# Patient Record
Sex: Female | Born: 1970 | Race: White | Hispanic: No | Marital: Married | Smoking: Never smoker
Health system: Southern US, Community
[De-identification: ages and names within clinical notes are randomized; demographics above are authoritative.]

## PROBLEM LIST (undated history)

## (undated) DIAGNOSIS — E559 Vitamin D deficiency, unspecified: Secondary | ICD-10-CM

## (undated) DIAGNOSIS — T7840XA Allergy, unspecified, initial encounter: Secondary | ICD-10-CM

## (undated) DIAGNOSIS — R768 Other specified abnormal immunological findings in serum: Secondary | ICD-10-CM

## (undated) DIAGNOSIS — E785 Hyperlipidemia, unspecified: Secondary | ICD-10-CM

## (undated) DIAGNOSIS — K209 Esophagitis, unspecified without bleeding: Secondary | ICD-10-CM

## (undated) DIAGNOSIS — G43909 Migraine, unspecified, not intractable, without status migrainosus: Secondary | ICD-10-CM

## (undated) DIAGNOSIS — K219 Gastro-esophageal reflux disease without esophagitis: Secondary | ICD-10-CM

## (undated) DIAGNOSIS — F329 Major depressive disorder, single episode, unspecified: Secondary | ICD-10-CM

## (undated) DIAGNOSIS — F32A Depression, unspecified: Secondary | ICD-10-CM

## (undated) DIAGNOSIS — G4700 Insomnia, unspecified: Secondary | ICD-10-CM

## (undated) HISTORY — DX: Major depressive disorder, single episode, unspecified: F32.9

## (undated) HISTORY — DX: Gastro-esophageal reflux disease without esophagitis: K21.9

## (undated) HISTORY — DX: Esophagitis, unspecified without bleeding: K20.90

## (undated) HISTORY — DX: Allergy, unspecified, initial encounter: T78.40XA

## (undated) HISTORY — DX: Vitamin D deficiency, unspecified: E55.9

## (undated) HISTORY — DX: Depression, unspecified: F32.A

## (undated) HISTORY — PX: MOLE REMOVAL: SHX2046

## (undated) HISTORY — DX: Hyperlipidemia, unspecified: E78.5

## (undated) HISTORY — DX: Migraine, unspecified, not intractable, without status migrainosus: G43.909

## (undated) HISTORY — DX: Other specified abnormal immunological findings in serum: R76.8

## (undated) HISTORY — DX: Insomnia, unspecified: G47.00

## (undated) HISTORY — PX: GYNECOLOGIC CRYOSURGERY: SHX857

## (undated) HISTORY — DX: Esophagitis, unspecified: K20.9

---
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
## 1998-11-15 ENCOUNTER — Inpatient Hospital Stay (HOSPITAL_COMMUNITY): Admission: AD | Admit: 1998-11-15 | Discharge: 1998-11-18 | Payer: Self-pay | Admitting: Obstetrics and Gynecology

## 1998-11-19 ENCOUNTER — Encounter: Admission: RE | Admit: 1998-11-19 | Discharge: 1999-02-17 | Payer: Self-pay | Admitting: Obstetrics and Gynecology

## 1998-12-29 ENCOUNTER — Other Ambulatory Visit: Admission: RE | Admit: 1998-12-29 | Discharge: 1998-12-29 | Payer: Self-pay | Admitting: Obstetrics and Gynecology

## 1999-12-26 ENCOUNTER — Other Ambulatory Visit: Admission: RE | Admit: 1999-12-26 | Discharge: 1999-12-26 | Payer: Self-pay | Admitting: Obstetrics and Gynecology

## 2000-02-06 ENCOUNTER — Inpatient Hospital Stay (HOSPITAL_COMMUNITY): Admission: AD | Admit: 2000-02-06 | Discharge: 2000-02-06 | Payer: Self-pay | Admitting: Obstetrics and Gynecology

## 2000-02-07 ENCOUNTER — Observation Stay (HOSPITAL_COMMUNITY): Admission: AD | Admit: 2000-02-07 | Discharge: 2000-02-07 | Payer: Self-pay | Admitting: Obstetrics and Gynecology

## 2000-05-08 ENCOUNTER — Inpatient Hospital Stay (HOSPITAL_COMMUNITY): Admission: AD | Admit: 2000-05-08 | Discharge: 2000-05-08 | Payer: Self-pay | Admitting: Obstetrics and Gynecology

## 2000-08-16 ENCOUNTER — Inpatient Hospital Stay (HOSPITAL_COMMUNITY): Admission: AD | Admit: 2000-08-16 | Discharge: 2000-08-18 | Payer: Self-pay | Admitting: Obstetrics and Gynecology

## 2000-08-19 ENCOUNTER — Encounter: Admission: RE | Admit: 2000-08-19 | Discharge: 2000-09-18 | Payer: Self-pay | Admitting: Obstetrics and Gynecology

## 2000-09-19 ENCOUNTER — Encounter: Admission: RE | Admit: 2000-09-19 | Discharge: 2000-10-19 | Payer: Self-pay | Admitting: Obstetrics and Gynecology

## 2000-10-08 ENCOUNTER — Other Ambulatory Visit: Admission: RE | Admit: 2000-10-08 | Discharge: 2000-10-08 | Payer: Self-pay | Admitting: Obstetrics and Gynecology

## 2000-11-19 ENCOUNTER — Encounter: Admission: RE | Admit: 2000-11-19 | Discharge: 2000-12-19 | Payer: Self-pay | Admitting: Obstetrics and Gynecology

## 2001-01-19 ENCOUNTER — Encounter: Admission: RE | Admit: 2001-01-19 | Discharge: 2001-02-18 | Payer: Self-pay | Admitting: Obstetrics and Gynecology

## 2001-02-19 ENCOUNTER — Encounter: Admission: RE | Admit: 2001-02-19 | Discharge: 2001-03-21 | Payer: Self-pay | Admitting: Obstetrics and Gynecology

## 2001-04-19 ENCOUNTER — Encounter: Admission: RE | Admit: 2001-04-19 | Discharge: 2001-05-19 | Payer: Self-pay | Admitting: Obstetrics and Gynecology

## 2001-11-12 ENCOUNTER — Other Ambulatory Visit: Admission: RE | Admit: 2001-11-12 | Discharge: 2001-11-12 | Payer: Self-pay | Admitting: Obstetrics and Gynecology

## 2002-06-18 ENCOUNTER — Ambulatory Visit (HOSPITAL_COMMUNITY): Admission: RE | Admit: 2002-06-18 | Discharge: 2002-06-18 | Payer: Self-pay | Admitting: Internal Medicine

## 2002-12-02 ENCOUNTER — Other Ambulatory Visit: Admission: RE | Admit: 2002-12-02 | Discharge: 2002-12-02 | Payer: Self-pay | Admitting: Obstetrics and Gynecology

## 2003-12-22 ENCOUNTER — Other Ambulatory Visit: Admission: RE | Admit: 2003-12-22 | Discharge: 2003-12-22 | Payer: Self-pay | Admitting: Obstetrics and Gynecology

## 2005-02-01 ENCOUNTER — Other Ambulatory Visit: Admission: RE | Admit: 2005-02-01 | Discharge: 2005-02-01 | Payer: Self-pay | Admitting: Obstetrics and Gynecology

## 2010-06-17 LAB — HM PAP SMEAR

## 2011-06-17 LAB — HM MAMMOGRAPHY

## 2011-07-12 LAB — FECAL OCCULT BLOOD, GUAIAC: Fecal Occult Blood: NEGATIVE

## 2012-07-16 ENCOUNTER — Other Ambulatory Visit (HOSPITAL_COMMUNITY): Payer: Self-pay | Admitting: Internal Medicine

## 2012-07-16 DIAGNOSIS — R1310 Dysphagia, unspecified: Secondary | ICD-10-CM

## 2012-07-22 ENCOUNTER — Ambulatory Visit (HOSPITAL_COMMUNITY)
Admission: RE | Admit: 2012-07-22 | Discharge: 2012-07-22 | Disposition: A | Discharge status: Home / Self Care | Payer: BC Managed Care – PPO | Source: Ambulatory Visit | Attending: Internal Medicine | Admitting: Internal Medicine

## 2012-07-22 DIAGNOSIS — R1310 Dysphagia, unspecified: Secondary | ICD-10-CM | POA: Insufficient documentation

## 2012-07-22 DIAGNOSIS — E041 Nontoxic single thyroid nodule: Secondary | ICD-10-CM | POA: Insufficient documentation

## 2012-07-25 ENCOUNTER — Other Ambulatory Visit: Payer: Self-pay | Admitting: Otolaryngology

## 2012-07-25 DIAGNOSIS — E041 Nontoxic single thyroid nodule: Secondary | ICD-10-CM

## 2012-08-06 ENCOUNTER — Ambulatory Visit
Admission: RE | Admit: 2012-08-06 | Discharge: 2012-08-06 | Disposition: A | Discharge status: Home / Self Care | Payer: BC Managed Care – PPO | Source: Ambulatory Visit | Attending: Otolaryngology | Admitting: Otolaryngology

## 2012-08-06 ENCOUNTER — Other Ambulatory Visit (HOSPITAL_COMMUNITY)
Admission: RE | Admit: 2012-08-06 | Discharge: 2012-08-06 | Disposition: A | Discharge status: Home / Self Care | Payer: BC Managed Care – PPO | Source: Ambulatory Visit | Attending: Interventional Radiology | Admitting: Interventional Radiology

## 2012-08-06 DIAGNOSIS — E049 Nontoxic goiter, unspecified: Secondary | ICD-10-CM | POA: Insufficient documentation

## 2012-08-06 DIAGNOSIS — E041 Nontoxic single thyroid nodule: Secondary | ICD-10-CM

## 2012-11-29 ENCOUNTER — Ambulatory Visit: Payer: BC Managed Care – PPO | Admitting: Emergency Medicine

## 2012-11-29 ENCOUNTER — Encounter: Payer: Self-pay | Admitting: Emergency Medicine

## 2012-11-29 VITALS — BP 110/62 | HR 66 | Temp 98.0°F | Resp 18 | Ht 65.0 in | Wt 148.0 lb

## 2012-11-29 DIAGNOSIS — E559 Vitamin D deficiency, unspecified: Secondary | ICD-10-CM

## 2012-11-29 DIAGNOSIS — E782 Mixed hyperlipidemia: Secondary | ICD-10-CM | POA: Insufficient documentation

## 2012-11-29 DIAGNOSIS — R5383 Other fatigue: Secondary | ICD-10-CM

## 2012-11-29 DIAGNOSIS — R300 Dysuria: Secondary | ICD-10-CM

## 2012-11-29 DIAGNOSIS — R5381 Other malaise: Secondary | ICD-10-CM

## 2012-11-29 LAB — CBC WITH DIFFERENTIAL/PLATELET
Basophils Absolute: 0 10*3/uL (ref 0.0–0.1)
Basophils Relative: 0 % (ref 0–1)
Eosinophils Absolute: 0.1 10*3/uL (ref 0.0–0.7)
Eosinophils Relative: 1 % (ref 0–5)
HCT: 41.6 % (ref 36.0–46.0)
Hemoglobin: 14 g/dL (ref 12.0–15.0)
Lymphocytes Relative: 18 % (ref 12–46)
Lymphs Abs: 1.7 10*3/uL (ref 0.7–4.0)
MCH: 30.4 pg (ref 26.0–34.0)
MCHC: 33.7 g/dL (ref 30.0–36.0)
MCV: 90.4 fL (ref 78.0–100.0)
Monocytes Absolute: 0.8 10*3/uL (ref 0.1–1.0)
Monocytes Relative: 8 % (ref 3–12)
Neutro Abs: 7.3 10*3/uL (ref 1.7–7.7)
Neutrophils Relative %: 73 % (ref 43–77)
Platelets: 287 10*3/uL (ref 150–400)
RBC: 4.6 MIL/uL (ref 3.87–5.11)
RDW: 13.1 % (ref 11.5–15.5)
WBC: 10 10*3/uL (ref 4.0–10.5)

## 2012-11-29 LAB — HEPATIC FUNCTION PANEL
ALT: 14 U/L (ref 0–35)
AST: 18 U/L (ref 0–37)
Albumin: 4.6 g/dL (ref 3.5–5.2)
Alkaline Phosphatase: 39 U/L (ref 39–117)
Bilirubin, Direct: 0.1 mg/dL (ref 0.0–0.3)
Indirect Bilirubin: 0.6 mg/dL (ref 0.0–0.9)
Total Bilirubin: 0.7 mg/dL (ref 0.3–1.2)
Total Protein: 7.7 g/dL (ref 6.0–8.3)

## 2012-11-29 LAB — LIPID PANEL
Cholesterol: 188 mg/dL (ref 0–200)
HDL: 77 mg/dL (ref >39)
LDL Cholesterol: 95 mg/dL (ref 0–99)
Total CHOL/HDL Ratio: 2.4 Ratio
Triglycerides: 78 mg/dL (ref <150)
VLDL: 16 mg/dL (ref 0–40)

## 2012-11-29 LAB — BASIC METABOLIC PANEL WITH GFR
BUN: 14 mg/dL (ref 6–23)
CO2: 30 mEq/L (ref 19–32)
Calcium: 10 mg/dL (ref 8.4–10.5)
Chloride: 101 mEq/L (ref 96–112)
Creat: 0.81 mg/dL (ref 0.50–1.10)
GFR, Est African American: >89 mL/min
GFR, Est Non African American: >89 mL/min
Glucose, Bld: 84 mg/dL (ref 70–99)
Potassium: 4.3 mEq/L (ref 3.5–5.3)
Sodium: 139 mEq/L (ref 135–145)

## 2012-11-29 LAB — MAGNESIUM: Magnesium: 2.2 mg/dL (ref 1.5–2.5)

## 2012-11-29 MED ORDER — CIPROFLOXACIN HCL 500 MG PO TABS: 500.0000 mg | ORAL_TABLET | Freq: Two times a day (BID) | ORAL | Status: AC

## 2012-11-29 NOTE — Progress Notes (Signed)
  Subjective:    Patient ID: [name], female    DOB: Sep 20, 1970, 43 y.o.   MRN: [id number]  HPI Comments: 42 YO PRESENTS FOR RECHECK OF CHOLESTEROL, Vit D, B12. She is eating healthy and exercises regularly. She increased RYRE to 2 pills at last ov. Decreased vit D to 5000 IU qd and stopped B12 because levels were elevated. She also needs evaluation for dysuria and frequency since this a.m. She notes she has not been doing well with H2O consumption and has been holding urine for long periods.    No current outpatient prescriptions on file prior to visit.   No current facility-administered medications on file prior to visit.   Past Medical History  Diagnosis Date  . Hyperlipidemia   . Allergy   . Migraine   . Unspecified vitamin D deficiency      Review of Systems  Constitutional: Positive for fever.  Gastrointestinal: Positive for abdominal pain.  Genitourinary: Positive for dysuria and frequency.  All other systems reviewed and are negative.       Objective:   Physical Exam  Nursing note and vitals reviewed. Constitutional: She is oriented to person, place, and time. She appears well-developed and well-nourished.  HENT:  Head: Normocephalic and atraumatic.  Eyes: Pupils are equal, round, and reactive to light.  Neck: Normal range of motion. Neck supple.  Cardiovascular: Normal rate, regular rhythm, normal heart sounds and intact distal pulses.   Pulmonary/Chest: Effort normal and breath sounds normal.  Abdominal: Soft. Bowel sounds are normal. She exhibits no mass. There is tenderness. There is no guarding.  suprapubic  Musculoskeletal: Normal range of motion.  Neurological: She is alert and oriented to person, place, and time.  Skin: Skin is warm and dry.  Psychiatric: She has a normal mood and affect. Judgment normal.          Assessment & Plan:  1. Recheck chol, b12, Vit D with dose changes.  2. Check labs for UTI, Cipro 500 AD if symptoms increase.  Hygiene explained. Increase H2O

## 2012-11-29 NOTE — Patient Instructions (Signed)
Fat and Cholesterol Control Diet Fat and cholesterol levels in your blood and organs are influenced by your diet. High levels of fat and cholesterol may lead to diseases of the heart, small and large blood vessels, gallbladder, liver, and pancreas. CONTROLLING FAT AND CHOLESTEROL WITH DIET Although exercise and lifestyle factors are important, your diet is key. That is because certain foods are known to raise cholesterol and others to lower it. The goal is to balance foods for their effect on cholesterol and more importantly, to replace saturated and trans fat with other types of fat, such as monounsaturated fat, polyunsaturated fat, and omega-3 fatty acids. On average, a person should consume no more than 15 to 17 g of saturated fat daily. Saturated and trans fats are considered "bad" fats, and they will raise LDL cholesterol. Saturated fats are primarily found in animal products such as meats, butter, and cream. However, that does not mean you need to give up all your favorite foods. Today, there are good tasting, low-fat, low-cholesterol substitutes for most of the things you like to eat. Choose low-fat or nonfat alternatives. Choose round or loin cuts of red meat. These types of cuts are lowest in fat and cholesterol. Chicken (without the skin), fish, veal, and ground turkey breast are great choices. Eliminate fatty meats, such as hot dogs and salami. Even shellfish have little or no saturated fat. Have a 3 oz (85 g) portion when you eat lean meat, poultry, or fish. Trans fats are also called "partially hydrogenated oils." They are oils that have been scientifically manipulated so that they are solid at room temperature resulting in a longer shelf life and improved taste and texture of foods in which they are added. Trans fats are found in stick margarine, some tub margarines, cookies, crackers, and baked goods.  When baking and cooking, oils are a great substitute for butter. The monounsaturated oils are  especially beneficial since it is believed they lower LDL and raise HDL. The oils you should avoid entirely are saturated tropical oils, such as coconut and palm.  Remember to eat a lot from food groups that are naturally free of saturated and trans fat, including fish, fruit, vegetables, beans, grains (barley, rice, couscous, bulgur wheat), and pasta (without cream sauces).  IDENTIFYING FOODS THAT LOWER FAT AND CHOLESTEROL  Soluble fiber may lower your cholesterol. This type of fiber is found in fruits such as apples, vegetables such as broccoli, potatoes, and carrots, legumes such as beans, peas, and lentils, and grains such as barley. Foods fortified with plant sterols (phytosterol) may also lower cholesterol. You should eat at least 2 g per day of these foods for a cholesterol lowering effect.  Read package labels to identify low-saturated fats, trans fat free, and low-fat foods at the supermarket. Select cheeses that have only 2 to 3 g saturated fat per ounce. Use a heart-healthy tub margarine that is free of trans fats or partially hydrogenated oil. When buying baked goods (cookies, crackers), avoid partially hydrogenated oils. Breads and muffins should be made from whole grains (whole-wheat or whole oat flour, instead of "flour" or "enriched flour"). Buy non-creamy canned soups with reduced salt and no added fats.  FOOD PREPARATION TECHNIQUES  Never deep-fry. If you must fry, either stir-fry, which uses very little fat, or use non-stick cooking sprays. When possible, broil, bake, or roast meats, and steam vegetables. Instead of putting butter or margarine on vegetables, use lemon and herbs, applesauce, and cinnamon (for squash and sweet potatoes). Use nonfat   yogurt, salsa, and low-fat dressings for salads.  LOW-SATURATED FAT / LOW-FAT FOOD SUBSTITUTES Meats / Saturated Fat (g)  Avoid: Steak, marbled (3 oz/85 g) / 11 g  Choose: Steak, lean (3 oz/85 g) / 4 g  Avoid: Hamburger (3 oz/85 g) / 7  g  Choose: Hamburger, lean (3 oz/85 g) / 5 g  Avoid: Ham (3 oz/85 g) / 6 g  Choose: Ham, lean cut (3 oz/85 g) / 2.4 g  Avoid: Chicken, with skin, dark meat (3 oz/85 g) / 4 g  Choose: Chicken, skin removed, dark meat (3 oz/85 g) / 2 g  Avoid: Chicken, with skin, light meat (3 oz/85 g) / 2.5 g  Choose: Chicken, skin removed, light meat (3 oz/85 g) / 1 g Dairy / Saturated Fat (g)  Avoid: Whole milk (1 cup) / 5 g  Choose: Low-fat milk, 2% (1 cup) / 3 g  Choose: Low-fat milk, 1% (1 cup) / 1.5 g  Choose: Skim milk (1 cup) / 0.3 g  Avoid: Hard cheese (1 oz/28 g) / 6 g  Choose: Skim milk cheese (1 oz/28 g) / 2 to 3 g  Avoid: Cottage cheese, 4% fat (1 cup) / 6.5 g  Choose: Low-fat cottage cheese, 1% fat (1 cup) / 1.5 g  Avoid: Ice cream (1 cup) / 9 g  Choose: Sherbet (1 cup) / 2.5 g  Choose: Nonfat frozen yogurt (1 cup) / 0.3 g  Choose: Frozen fruit bar / trace  Avoid: Whipped cream (1 tbs) / 3.5 g  Choose: Nondairy whipped topping (1 tbs) / 1 g Condiments / Saturated Fat (g)  Avoid: Mayonnaise (1 tbs) / 2 g  Choose: Low-fat mayonnaise (1 tbs) / 1 g  Avoid: Butter (1 tbs) / 7 g  Choose: Extra light margarine (1 tbs) / 1 g  Avoid: Coconut oil (1 tbs) / 11.8 g  Choose: Olive oil (1 tbs) / 1.8 g  Choose: Corn oil (1 tbs) / 1.7 g  Choose: Safflower oil (1 tbs) / 1.2 g  Choose: Sunflower oil (1 tbs) / 1.4 g  Choose: Soybean oil (1 tbs) / 2.4 g  Choose: Canola oil (1 tbs) / 1 g Document Released: 01/02/2005 Document Revised: 04/29/2012 Document Reviewed: 06/23/2010 [employer] Patient Information 2014 [place], [place]. Urinary Tract Infection Urinary tract infections (UTIs) can develop anywhere along your urinary tract. Your urinary tract is your body's drainage system for removing wastes and extra water. Your urinary tract includes two kidneys, two ureters, a bladder, and a urethra. Your kidneys are a pair of bean-shaped organs. Each kidney is about the size of  your fist. They are located below your ribs, one on each side of your spine. CAUSES Infections are caused by microbes, which are microscopic organisms, including fungi, viruses, and bacteria. These organisms are so small that they can only be seen through a microscope. Bacteria are the microbes that most commonly cause UTIs. SYMPTOMS  Symptoms of UTIs may vary by age and gender of the patient and by the location of the infection. Symptoms in young women typically include a frequent and intense urge to urinate and a painful, burning feeling in the bladder or urethra during urination. Older women and men are more likely to be tired, shaky, and weak and have muscle aches and abdominal pain. A fever may mean the infection is in your kidneys. Other symptoms of a kidney infection include pain in your back or sides below the ribs, nausea, and vomiting. DIAGNOSIS To diagnose a  UTI, your caregiver will ask you about your symptoms. Your caregiver also will ask to provide a urine sample. The urine sample will be tested for bacteria and white blood cells. White blood cells are made by your body to help fight infection. TREATMENT  Typically, UTIs can be treated with medication. Because most UTIs are caused by a bacterial infection, they usually can be treated with the use of antibiotics. The choice of antibiotic and length of treatment depend on your symptoms and the type of bacteria causing your infection. HOME CARE INSTRUCTIONS  If you were prescribed antibiotics, take them exactly as your caregiver instructs you. Finish the medication even if you feel better after you have only taken some of the medication.  Drink enough water and fluids to keep your urine clear or pale yellow.  Avoid caffeine, tea, and carbonated beverages. They tend to irritate your bladder.  Empty your bladder often. Avoid holding urine for long periods of time.  Empty your bladder before and after sexual intercourse.  After a bowel  movement, women should cleanse from front to back. Use each tissue only once. SEEK MEDICAL CARE IF:   You have back pain.  You develop a fever.  Your symptoms do not begin to resolve within 3 days. SEEK IMMEDIATE MEDICAL CARE IF:   You have severe back pain or lower abdominal pain.  You develop chills.  You have nausea or vomiting.  You have continued burning or discomfort with urination. MAKE SURE YOU:   Understand these instructions.  Will watch your condition.  Will get help right away if you are not doing well or get worse. Document Released: 10/12/2004 Document Revised: 07/04/2011 Document Reviewed: 02/10/2011 [facility] Patient Information 2014 [place], [place].

## 2012-11-30 LAB — TSH: TSH: 1.145 u[IU]/mL (ref 0.350–4.500)

## 2012-11-30 LAB — URINALYSIS, ROUTINE W REFLEX MICROSCOPIC
Bilirubin Urine: NEGATIVE
Glucose, UA: NEGATIVE mg/dL
Ketones, ur: NEGATIVE mg/dL
Nitrite: NEGATIVE
Protein, ur: NEGATIVE mg/dL
Specific Gravity, Urine: 1.013 (ref 1.005–1.030)
Urobilinogen, UA: 0.2 mg/dL (ref 0.0–1.0)
pH: 8 (ref 5.0–8.0)

## 2012-11-30 LAB — URINALYSIS, MICROSCOPIC ONLY
Casts: NONE SEEN
Crystals: NONE SEEN
Squamous Epithelial / LPF: NONE SEEN

## 2012-11-30 LAB — VITAMIN B12: Vitamin B-12: 602 pg/mL (ref 211–911)

## 2012-11-30 LAB — VITAMIN D 25 HYDROXY (VIT D DEFICIENCY, FRACTURES): Vit D, 25-Hydroxy: 100 ng/mL — ABNORMAL HIGH (ref 30–89)

## 2012-12-01 LAB — URINE CULTURE: Culture: >=100000

## 2012-12-03 ENCOUNTER — Other Ambulatory Visit: Payer: Self-pay | Admitting: Internal Medicine

## 2012-12-03 MED ORDER — NITROFURANTOIN MONOHYD MACRO 100 MG PO CAPS: 100.0000 mg | ORAL_CAPSULE | Freq: Two times a day (BID) | ORAL | Status: AC

## 2012-12-27 ENCOUNTER — Other Ambulatory Visit: Payer: Self-pay | Admitting: Emergency Medicine

## 2012-12-30 ENCOUNTER — Other Ambulatory Visit: Payer: Self-pay | Admitting: Emergency Medicine

## 2012-12-30 ENCOUNTER — Other Ambulatory Visit: Payer: Self-pay

## 2012-12-30 DIAGNOSIS — E559 Vitamin D deficiency, unspecified: Secondary | ICD-10-CM

## 2012-12-30 DIAGNOSIS — N390 Urinary tract infection, site not specified: Secondary | ICD-10-CM

## 2012-12-30 DIAGNOSIS — E538 Deficiency of other specified B group vitamins: Secondary | ICD-10-CM

## 2012-12-30 DIAGNOSIS — A048 Other specified bacterial intestinal infections: Secondary | ICD-10-CM

## 2012-12-31 ENCOUNTER — Other Ambulatory Visit: Payer: Self-pay

## 2013-01-01 ENCOUNTER — Other Ambulatory Visit: Payer: BC Managed Care – PPO

## 2013-01-01 DIAGNOSIS — E559 Vitamin D deficiency, unspecified: Secondary | ICD-10-CM

## 2013-01-01 DIAGNOSIS — N390 Urinary tract infection, site not specified: Secondary | ICD-10-CM

## 2013-01-01 DIAGNOSIS — N3000 Acute cystitis without hematuria: Secondary | ICD-10-CM

## 2013-01-01 DIAGNOSIS — I10 Essential (primary) hypertension: Secondary | ICD-10-CM

## 2013-01-01 DIAGNOSIS — R5383 Other fatigue: Secondary | ICD-10-CM

## 2013-01-01 DIAGNOSIS — E538 Deficiency of other specified B group vitamins: Secondary | ICD-10-CM

## 2013-01-01 DIAGNOSIS — R5381 Other malaise: Secondary | ICD-10-CM

## 2013-01-01 LAB — CBC WITH DIFFERENTIAL/PLATELET
Basophils Absolute: 0 10*3/uL (ref 0.0–0.1)
Basophils Relative: 1 % (ref 0–1)
Eosinophils Absolute: 0.1 10*3/uL (ref 0.0–0.7)
Eosinophils Relative: 2 % (ref 0–5)
HCT: 41.5 % (ref 36.0–46.0)
Hemoglobin: 13.8 g/dL (ref 12.0–15.0)
Lymphocytes Relative: 33 % (ref 12–46)
Lymphs Abs: 2.1 10*3/uL (ref 0.7–4.0)
MCH: 29.9 pg (ref 26.0–34.0)
MCHC: 33.3 g/dL (ref 30.0–36.0)
MCV: 90 fL (ref 78.0–100.0)
Monocytes Absolute: 0.6 10*3/uL (ref 0.1–1.0)
Monocytes Relative: 9 % (ref 3–12)
Neutro Abs: 3.5 10*3/uL (ref 1.7–7.7)
Neutrophils Relative %: 55 % (ref 43–77)
Platelets: 269 10*3/uL (ref 150–400)
RBC: 4.61 MIL/uL (ref 3.87–5.11)
RDW: 12.7 % (ref 11.5–15.5)
WBC: 6.3 10*3/uL (ref 4.0–10.5)

## 2013-01-01 LAB — VITAMIN B12: Vitamin B-12: 605 pg/mL (ref 211–911)

## 2013-01-02 LAB — URINALYSIS, ROUTINE W REFLEX MICROSCOPIC
Bilirubin Urine: NEGATIVE
Glucose, UA: NEGATIVE mg/dL
Hgb urine dipstick: NEGATIVE
Ketones, ur: NEGATIVE mg/dL
Leukocytes, UA: NEGATIVE
Nitrite: NEGATIVE
Protein, ur: NEGATIVE mg/dL
Specific Gravity, Urine: 1.016 (ref 1.005–1.030)
Urobilinogen, UA: 0.2 mg/dL (ref 0.0–1.0)
pH: 7 (ref 5.0–8.0)

## 2013-01-02 LAB — URINE CULTURE
Colony Count: NO GROWTH
Organism ID, Bacteria: NO GROWTH

## 2013-01-02 LAB — VITAMIN D 25 HYDROXY (VIT D DEFICIENCY, FRACTURES): Vit D, 25-Hydroxy: 86 ng/mL (ref 30–89)

## 2013-02-17 ENCOUNTER — Ambulatory Visit (INDEPENDENT_AMBULATORY_CARE_PROVIDER_SITE_OTHER): Payer: BC Managed Care – PPO | Admitting: Emergency Medicine

## 2013-02-17 ENCOUNTER — Encounter: Payer: Self-pay | Admitting: Emergency Medicine

## 2013-02-17 VITALS — BP 112/78 | HR 78 | Temp 98.0°F | Resp 18 | Ht 65.25 in | Wt 153.0 lb

## 2013-02-17 DIAGNOSIS — N63 Unspecified lump in unspecified breast: Secondary | ICD-10-CM

## 2013-02-17 DIAGNOSIS — N644 Mastodynia: Secondary | ICD-10-CM

## 2013-02-17 DIAGNOSIS — N6311 Unspecified lump in the right breast, upper outer quadrant: Secondary | ICD-10-CM

## 2013-02-17 NOTE — Patient Instructions (Signed)
Breast Tenderness Breast tenderness is a common problem for women of all ages. Breast tenderness may cause mild discomfort to severe pain. It has a variety of causes. Your health care provider will find out the likely cause of your breast tenderness by examining your breasts, asking you about symptoms, and ordering some tests. Breast tenderness usually does not mean you have breast cancer. HOME CARE INSTRUCTIONS  Breast tenderness often can be handled at home. You can try:  Getting fitted for a new bra that provides more support, especially during exercise.  Wearing a more supportive bra or sports bra while sleeping when your breasts are very tender.  If you have a breast injury, apply ice to the area:  Put ice in a plastic bag.  Place a towel between your skin and the bag.  Leave the ice on for 20 minutes, 2 3 times a day.  If your breasts are too full of milk as a result of breastfeeding, try:  Expressing milk either by hand or with a breast pump.  Applying a warm compress to the breasts for relief.  Taking over-the-counter pain relievers, if approved by your health care provider.  Taking other medicines that your health care provider prescribes. These may include antibiotic medicines or birth control pills. Over the long term, your breast tenderness might be eased if you:  Cut down on caffeine.  Reduce the amount of fat in your diet. Keep a log of the days and times when your breasts are most tender. This will help you and your health care provider find the cause of the tenderness and how to relieve it. Also, learn how to do breast exams at home. This will help you notice if you have an unusual growth or lump that could cause tenderness. SEEK MEDICAL CARE IF:   Any part of your breast is hard, red, and hot to the touch. This could be a sign of infection.  Fluid is coming out of your nipples (and you are not breastfeeding). Especially watch for blood or pus.  You have a fever  as well as breast tenderness.  You have a new or painful lump in your breast that remains after your menstrual period ends.  You have tried to take care of the pain at home, but it has not gone away.  Your breast pain is getting worse, or the pain is making it hard to do the things you usually do during your day. Document Released: 12/16/2007 Document Revised: 09/04/2012 Document Reviewed: 08/01/2012 [employer] Patient Information 2014 [employer].  

## 2013-02-17 NOTE — Progress Notes (Signed)
   Subjective:    Patient ID: Shawna KnifeMonique W Justice, female    DOB: October 01, 1970, 43 y.o.   MRN: [id number]  HPI Comments: 43 yo female with breast pain on/off x several months. She notes pain has been more frequent since Dec. She notes self exam deep palpitation with mild edema. Yesterday symptoms increased with swelling and pain. 3D mammo in June WNL. No Hx of abnormal mammos. No breast cancer hx in family. She has had IUD for several years due for replacement in May- Merena. She has noted last 2 months with break through d/c. She has increased stress with work/ family. She denies trauma to area.  Current Outpatient Prescriptions on File Prior to Visit  Medication Sig Dispense Refill  . Ascorbic Acid (VITAMIN C) 1000 MG tablet Take 1,000 mg by mouth daily.      Marland Kitchen. aspirin 81 MG tablet Take 81 mg by mouth daily.      . calcium carbonate (OS-CAL) 600 MG TABS tablet Take 600 mg by mouth 2 (two) times daily with a meal.      . dexlansoprazole (DEXILANT) 60 MG capsule Take 60 mg by mouth daily.      . Magnesium 250 MG TABS Take by mouth.      . Multiple Vitamins-Minerals (MULTIVITAMIN WITH MINERALS) tablet Take 1 tablet by mouth. Three times per week      . Red Yeast Rice 600 MG CAPS Take 600 mg by mouth 2 (two) times daily.       No current facility-administered medications on file prior to visit.   ALLERGIES Tetracyclines & related  Past Medical History  Diagnosis Date  . Hyperlipidemia   . Allergy   . Migraine   . Unspecified vitamin D deficiency      Review of Systems  Constitutional:       Right Breast pain  HENT: Positive for postnasal drip.   Respiratory: Positive for cough.   Musculoskeletal: Positive for myalgias.  All other systems reviewed and are negative.   BP 112/78  Pulse 78  Temp(Src) 98 F (36.7 C) (Temporal)  Resp 18  Ht 5' 5.25" (1.657 m)  Wt 153 lb (69.4 kg)  BMI 25.28 kg/m2     Objective:   Physical Exam  Nursing note and vitals reviewed. Constitutional:  She is oriented to person, place, and time. She appears well-developed and well-nourished.  HENT:  Head: Normocephalic and atraumatic.  Right Ear: External ear normal.  Left Ear: External ear normal.  Nose: Nose normal.  Mouth/Throat: Oropharynx is clear and moist.  Cloudy TM's bilaterally   Eyes: Conjunctivae and EOM are normal.  Neck: Normal range of motion.  Cardiovascular: Normal rate, regular rhythm, normal heart sounds and intact distal pulses.   Pulmonary/Chest: Effort normal and breath sounds normal.  Genitourinary:  Right breast with fullness/ pain at 10 o'clock area axillary with approx nickle size fullness  Musculoskeletal: Normal range of motion.  Lymphadenopathy:    She has no cervical adenopathy.  Neurological: She is alert and oriented to person, place, and time.  Skin: Skin is warm and dry.  Psychiatric: She has a normal mood and affect. Judgment normal.          Assessment & Plan:  1.Right breast pain with ? Lump/ fullness at 10 o'clock axillary- Get DX mammo and MRI to evaluate 2. Cough/ Allergic rhinitis- Allegra OTC, increase H2o, allergy hygiene explained.

## 2013-02-18 ENCOUNTER — Other Ambulatory Visit: Payer: Self-pay | Admitting: Emergency Medicine

## 2013-02-18 ENCOUNTER — Other Ambulatory Visit: Payer: BC Managed Care – PPO

## 2013-02-18 DIAGNOSIS — A048 Other specified bacterial intestinal infections: Secondary | ICD-10-CM

## 2013-02-19 LAB — HELICOBACTER PYLORI  SPECIAL ANTIGEN: H. PYLORI Antigen: NEGATIVE

## 2013-02-20 ENCOUNTER — Other Ambulatory Visit: Payer: Self-pay

## 2013-02-20 DIAGNOSIS — N6311 Unspecified lump in the right breast, upper outer quadrant: Secondary | ICD-10-CM

## 2013-02-20 DIAGNOSIS — N644 Mastodynia: Secondary | ICD-10-CM

## 2013-03-25 ENCOUNTER — Other Ambulatory Visit: Payer: Self-pay | Admitting: Emergency Medicine

## 2013-06-10 ENCOUNTER — Other Ambulatory Visit: Payer: Self-pay | Admitting: Emergency Medicine

## 2013-06-26 ENCOUNTER — Other Ambulatory Visit: Payer: Self-pay | Admitting: Emergency Medicine

## 2013-06-26 DIAGNOSIS — R221 Localized swelling, mass and lump, neck: Secondary | ICD-10-CM

## 2013-07-09 ENCOUNTER — Ambulatory Visit (INDEPENDENT_AMBULATORY_CARE_PROVIDER_SITE_OTHER): Payer: BC Managed Care – PPO | Admitting: Internal Medicine

## 2013-07-09 ENCOUNTER — Encounter: Payer: Self-pay | Admitting: Internal Medicine

## 2013-07-09 VITALS — BP 104/64 | HR 80 | Temp 98.9°F | Resp 12 | Ht 65.25 in | Wt 151.0 lb

## 2013-07-09 DIAGNOSIS — R220 Localized swelling, mass and lump, head: Secondary | ICD-10-CM

## 2013-07-09 DIAGNOSIS — E041 Nontoxic single thyroid nodule: Secondary | ICD-10-CM

## 2013-07-09 DIAGNOSIS — R221 Localized swelling, mass and lump, neck: Secondary | ICD-10-CM

## 2013-07-09 HISTORY — DX: Nontoxic single thyroid nodule: E04.1

## 2013-07-09 LAB — TSH: TSH: 0.16 u[IU]/mL — ABNORMAL LOW (ref 0.35–4.50)

## 2013-07-09 LAB — T3, FREE: T3, Free: 2.8 pg/mL (ref 2.3–4.2)

## 2013-07-09 LAB — T4, FREE: Free T4: 0.67 ng/dL (ref 0.60–1.60)

## 2013-07-09 NOTE — Patient Instructions (Signed)
Please stop at the lab. Please return in 1 year for recheck.

## 2013-07-09 NOTE — Progress Notes (Signed)
Patient ID: Shawna Forbes, female   DOB: 1970-12-17, 43 y.o.   MRN: [id number]   HPI  Shawna Forbes is a 43 y.o.-year-old female, referred by her PCP, Loree FeeMelissa Smith PA, in consultation for neck pressure in the context of a R thyroid nodule.  Pt started to have neck pressure ~ 1.5 years ago. For this reason, she had a Thyroid U/S (07/22/2012): R thyroid nodule 2.1 cm in the R lobe. FNA of this nodule (08/06/2012) >> benign. She persists in having the same neck pressure sxs, which are not permanent, but waxing and waning.  I reviewed pt's thyroid tests: Lab Results  Component Value Date   TSH 1.145 11/29/2012    Pt denies feeling nodules in neck, + pressure occasionally, + hoarseness, no dysphagia/no odynophagia, SOB with lying down.  Pt also c/o: - + heat intolerance - only this year - wakes up many times at night - for 2 years (benadryl) - no tremors - no palpitations - no anxiety/depression - no hyperdefecation/constipation - + weight gain 7-10 lbs in last 1.5 years - no dry skin - no hair falling  Pt does have a FH of thyroid ds: mother, father and aunts. No FH of thyroid cancer. No h/o radiation tx to head or neck.  No seaweed or kelp, no recent contrast studies. No steroid use. No herbal supplements.   She has acid reflux (after she was tx for H pylori) and is on Dexilant.   ROS: Constitutional: see HPi Eyes: no blurry vision, no xerophthalmia ENT: see HPI Cardiovascular: no CP/SOB/palpitations/leg swelling Respiratory: no cough/SOB Gastrointestinal: no N/V/D/C Musculoskeletal: no muscle/joint aches Skin: no rashes Neurological: no tremors/numbness/tingling/dizziness Psychiatric: no depression/anxiety  Past Medical History  Diagnosis Date  . Hyperlipidemia   . Allergy   . Migraine   . Unspecified vitamin D deficiency    Past Surgical History  Procedure Laterality Date  . Gynecologic cryosurgery    . Mole removal      multiple benign- Dr. Emily FilbertGould    History   Social History  . Marital Status: Married    Spouse Name: N/A    Number of Children: 2: 7314 and 43 y/o   Occupational History  . Emergency planning/management officerroject manager   Social History Main Topics  . Smoking status: Never Smoker   . Smokeless tobacco: Not on file  . Alcohol Use: No  . Drug Use: No  . Sexual Activity: Yes   Current Outpatient Prescriptions on File Prior to Visit  Medication Sig Dispense Refill  . Ascorbic Acid (VITAMIN C) 1000 MG tablet Take 1,000 mg by mouth daily.      Marland Kitchen. aspirin 81 MG tablet Take 81 mg by mouth daily.      . calcium carbonate (OS-CAL) 600 MG TABS tablet Take 600 mg by mouth 2 (two) times daily with a meal.      . DEXILANT 60 MG capsule TAKE 1 CAPSULE EVERY DAY  30 capsule  3  . ketoprofen (ORUDIS) 75 MG capsule TAKE 1 CAPSULE 3 TIMES A DAY  90 capsule  0  . Magnesium 250 MG TABS Take by mouth.      . Multiple Vitamins-Minerals (MULTIVITAMIN WITH MINERALS) tablet Take 1 tablet by mouth. Three times per week      . Red Yeast Rice 600 MG CAPS Take 600 mg by mouth 2 (two) times daily.      . Vitamin D, Cholecalciferol, 1000 UNITS TABS Take 1,000 Units by mouth. Three times per week  No current facility-administered medications on file prior to visit.   Allergies  Allergen Reactions  . Tetracyclines & Related     Hives   Family History  Problem Relation Age of Onset  . Hypertension Mother   . Hyperlipidemia Mother   . Hypothyroidism Mother   . Hypothyroidism Father   . Cancer Maternal Grandfather     spine  . Diabetes Other     juvenille   PE: BP 104/64  Pulse 80  Temp(Src) 98.9 F (37.2 C) (Oral)  Resp 12  Ht 5' 5.25" (1.657 m)  Wt 151 lb (68.493 kg)  BMI 24.95 kg/m2  SpO2 98% Wt Readings from Last 3 Encounters:  07/09/13 151 lb (68.493 kg)  02/17/13 153 lb (69.4 kg)  11/29/12 148 lb (67.132 kg)   Constitutional: normal weight, in NAD Eyes: PERRLA, EOMI, no exophthalmos ENT: moist mucous membranes, no thyromegaly, no cervical  lymphadenopathy Cardiovascular: RRR, No MRG Respiratory: CTA B Gastrointestinal: abdomen soft, NT, ND, BS+ Musculoskeletal: no deformities, strength intact in all 4;  Skin: moist, warm, no rashes Neurological: no tremor with outstretched hands, DTR normal in all 4  ASSESSMENT: 1. R thyroid nodule - thyroid U/S (07/22/2012):  Right thyroid lobe: 5.2 x 2.0 x 1.3 cm. Inhomogeneous.   Left thyroid lobe: 5.4 x 1.6 x 1.2 cm. Homogeneous.   Isthmus: 0.3 cm   Focal nodules: Largest nodule is seen in the lower pole region on the right, 2.1 x 1.0 x 1.4 cm. Nodule is solid and vascular. No associated microcalcifications or cystic spaces. Smaller nodules measures 0.3 x 0.9 x 0.9 cm and 0.3 x 0.8 x 0.9 cm in the mid and upper pole regions of the right thyroid lobe. No discrete nodules are identified in the left thyroid lobe.   Lymphadenopathy: None visualized. - FNA R thyroid nodule (08/06/2013): benign  2. Neck swelling/fullness  PLAN: 1. R thyroid nodule and 2. R neck pressure sensation - I reviewed the images of her thyroid ultrasound along with the patient. I pointed out that the dominant nodule was large, but it is very reassuring that the Bx was negative. We may need to repeat the Bx in the future, but not needed for now. We discussed that there is nothing in the thyroid gland that appears to press on her trachea, and the thyroid gland is borderline, not increased in size to cause compression sxs - the only thing that can cause fluctuation in the thyroid gland size and occasional compression is an increased anti-thyroid Ab level >> Hashimoto's thyroiditis >> we will check for these - will also check the TFTs since the last check was last year. - I will see her back in a year >> we will get a new thyroid U/S then to follow the R thyroid nodule  Office Visit on 07/09/2013  Component Date Value Ref Range Status  . TSH 07/09/2013 0.16* 0.35 - 4.50 uIU/mL Final  . Free T4 07/09/2013 0.67  0.60  - 1.60 ng/dL Final  . T3, Free 21/30/865706/24/2015 2.8  2.3 - 4.2 pg/mL Final  . Thyroid Peroxidase Antibody 07/09/2013 36.6* <35.0 IU/mL Final   Comment:                            The thyroid microsomal antigen has been shown to be Thyroid  Peroxidase (TPO).  This assay detects anti-TPO antibodies.  . Thyroglobulin Ab 07/09/2013 <20.0  <40.0 IU/mL Final   TPO Abs a little high, while TSH low. It is unclear whether she has Hashimoto's thyroiditis or subacute thyroiditis. We will need to recheck the labs in 6 weeks to further clarify. At this point, it is possible that her neck pressure sxs are 2/2 thyroid inflammation.

## 2013-07-10 LAB — THYROGLOBULIN ANTIBODY: Thyroglobulin Ab: <20 IU/mL (ref <40.0)

## 2013-07-10 LAB — THYROID PEROXIDASE ANTIBODY: Thyroperoxidase Ab SerPl-aCnc: 36.6 IU/mL — ABNORMAL HIGH (ref <35.0)

## 2013-07-24 ENCOUNTER — Other Ambulatory Visit: Payer: Self-pay | Admitting: Emergency Medicine

## 2013-07-29 ENCOUNTER — Encounter: Payer: Self-pay | Admitting: Emergency Medicine

## 2013-08-06 ENCOUNTER — Other Ambulatory Visit (INDEPENDENT_AMBULATORY_CARE_PROVIDER_SITE_OTHER): Payer: BC Managed Care – PPO

## 2013-08-06 DIAGNOSIS — R221 Localized swelling, mass and lump, neck: Secondary | ICD-10-CM

## 2013-08-06 DIAGNOSIS — R220 Localized swelling, mass and lump, head: Secondary | ICD-10-CM

## 2013-08-06 LAB — T3, FREE: T3, Free: 2.9 pg/mL (ref 2.3–4.2)

## 2013-08-06 LAB — T4, FREE: Free T4: 0.73 ng/dL (ref 0.60–1.60)

## 2013-08-06 LAB — TSH: TSH: 1.01 u[IU]/mL (ref 0.35–4.50)

## 2013-08-07 LAB — THYROID PEROXIDASE ANTIBODY: Thyroperoxidase Ab SerPl-aCnc: 35.4 IU/mL — ABNORMAL HIGH (ref <35.0)

## 2013-09-02 ENCOUNTER — Encounter: Payer: Self-pay | Admitting: Physician Assistant

## 2013-10-20 ENCOUNTER — Other Ambulatory Visit: Payer: Self-pay | Admitting: Internal Medicine

## 2013-10-20 MED ORDER — DEXLANSOPRAZOLE 60 MG PO CPDR: 60.0000 mg | DELAYED_RELEASE_CAPSULE | Freq: Every day | ORAL | Status: DC

## 2013-10-29 ENCOUNTER — Encounter: Payer: Self-pay | Admitting: Emergency Medicine

## 2013-11-20 ENCOUNTER — Ambulatory Visit (INDEPENDENT_AMBULATORY_CARE_PROVIDER_SITE_OTHER): Payer: BC Managed Care – PPO | Admitting: Emergency Medicine

## 2013-11-20 ENCOUNTER — Encounter: Payer: Self-pay | Admitting: Emergency Medicine

## 2013-11-20 VITALS — BP 106/64 | HR 70 | Temp 98.6°F | Resp 18 | Wt 160.0 lb

## 2013-11-20 DIAGNOSIS — K219 Gastro-esophageal reflux disease without esophagitis: Secondary | ICD-10-CM

## 2013-11-20 DIAGNOSIS — Z0001 Encounter for general adult medical examination with abnormal findings: Secondary | ICD-10-CM

## 2013-11-20 DIAGNOSIS — K2100 Gastro-esophageal reflux disease with esophagitis, without bleeding: Secondary | ICD-10-CM

## 2013-11-20 DIAGNOSIS — Z111 Encounter for screening for respiratory tuberculosis: Secondary | ICD-10-CM

## 2013-11-20 DIAGNOSIS — R6889 Other general symptoms and signs: Secondary | ICD-10-CM

## 2013-11-20 DIAGNOSIS — Z0000 Encounter for general adult medical examination without abnormal findings: Secondary | ICD-10-CM

## 2013-11-20 DIAGNOSIS — Z79899 Other long term (current) drug therapy: Secondary | ICD-10-CM

## 2013-11-20 DIAGNOSIS — Z1212 Encounter for screening for malignant neoplasm of rectum: Secondary | ICD-10-CM

## 2013-11-20 DIAGNOSIS — E559 Vitamin D deficiency, unspecified: Secondary | ICD-10-CM

## 2013-11-20 LAB — CBC WITH DIFFERENTIAL/PLATELET
Basophils Absolute: 0.1 10*3/uL (ref 0.0–0.1)
Basophils Relative: 1 % (ref 0–1)
Eosinophils Absolute: 0.1 10*3/uL (ref 0.0–0.7)
Eosinophils Relative: 2 % (ref 0–5)
HCT: 41.3 % (ref 36.0–46.0)
Hemoglobin: 14.1 g/dL (ref 12.0–15.0)
Lymphocytes Relative: 45 % (ref 12–46)
Lymphs Abs: 3.2 10*3/uL (ref 0.7–4.0)
MCH: 29.9 pg (ref 26.0–34.0)
MCHC: 34.1 g/dL (ref 30.0–36.0)
MCV: 87.7 fL (ref 78.0–100.0)
Monocytes Absolute: 0.4 10*3/uL (ref 0.1–1.0)
Monocytes Relative: 6 % (ref 3–12)
Neutro Abs: 3.3 10*3/uL (ref 1.7–7.7)
Neutrophils Relative %: 46 % (ref 43–77)
Platelets: 312 10*3/uL (ref 150–400)
RBC: 4.71 MIL/uL (ref 3.87–5.11)
RDW: 13 % (ref 11.5–15.5)
WBC: 7.1 10*3/uL (ref 4.0–10.5)

## 2013-11-20 LAB — HEMOGLOBIN A1C
Hgb A1c MFr Bld: 5 % (ref <5.7)
Mean Plasma Glucose: 97 mg/dL (ref <117)

## 2013-11-20 MED ORDER — ELETRIPTAN HYDROBROMIDE 40 MG PO TABS: 40.0000 mg | ORAL_TABLET | ORAL | Status: DC | PRN

## 2013-11-20 NOTE — Progress Notes (Signed)
Subjective:    Patient ID: Shawna Forbes, female    DOB: [birth year], 43 y.o.   MRN: [id number]  HPI Comments: 43 yo pleasant WF CPE. She is eating healthy. She has not been exercising with increased depression with feeling bad. She notes her thyroid seems to be "on/off" and feels this and GERD are making her feel bad.  She has tried to stop Dexilant but has increased burning, reflux/ cough. She has to sleep sitting up. She has tried reflux diet without relief. She was treated and eradicated for h-pylori earlier in the year.  She saw DR Burton A[facility]yGerrhig END[facility] and was not satisfied with results with regards to thyroid. She notes mild swelling on/off. Thyroid peroxidase was elevated at last lab. She has history of nodule with biopsy NEG.  She notes migraines have been better. She has been using aleve sporadically and rarely uses RX.     Medication List       This list is accurate as of: 11/20/13 11:59 PM.  Always use your most recent med list.               aspirin 81 MG tablet  Take 81 mg by mouth daily.     calcium carbonate 600 MG Tabs tablet  Commonly known as:  OS-CAL  Take 600 mg by mouth 2 (two) times daily with a meal.     dexlansoprazole 60 MG capsule  Commonly known as:  DEXILANT  Take 1 capsule (60 mg total) by mouth daily.     eletriptan 40 MG tablet  Commonly known as:  RELPAX  Take 1 tablet (40 mg total) by mouth as needed for migraine or headache. One tablet by mouth at onset of headache. May repeat in 2 hours if headache persists or recurs.     Magnesium 250 MG Tabs  Take by mouth.     multivitamin with minerals tablet  Take 1 tablet by mouth. Three times per week     naproxen sodium 220 MG tablet  Commonly known as:  ANAPROX  Take 220 mg by mouth 2 (two) times daily with a meal.     Red Yeast Rice 600 MG Caps  Take 600 mg by mouth 2 (two) times daily.     vitamin C 1000 MG tablet  Take 1,000 mg by mouth daily.     Vitamin D (Cholecalciferol) 1000  UNITS Tabs  Take 1,000 Units by mouth. Three times per week       Allergies  Allergen Reactions  . Tetracyclines & Related     Hives   Current Outpatient Prescriptions on File Prior to Visit  Medication Sig Dispense Refill  . Ascorbic Acid (VITAMIN C) 1000 MG tablet Take 1,000 mg by mouth daily.    Marland Kitchen. aspirin 81 MG tablet Take 81 mg by mouth daily.    . calcium carbonate (OS-CAL) 600 MG TABS tablet Take 600 mg by mouth 2 (two) times daily with a meal.    . dexlansoprazole (DEXILANT) 60 MG capsule Take 1 capsule (60 mg total) by mouth daily. 30 capsule 5  . Magnesium 250 MG TABS Take by mouth.    . Multiple Vitamins-Minerals (MULTIVITAMIN WITH MINERALS) tablet Take 1 tablet by mouth. Three times per week    . Red Yeast Rice 600 MG CAPS Take 600 mg by mouth 2 (two) times daily.    . Vitamin D, Cholecalciferol, 1000 UNITS TABS Take 1,000 Units by mouth. Three times per week  No current facility-administered medications on file prior to visit.   Allergies  Allergen Reactions  . Tetracyclines & Related     Hives   Past Medical History  Diagnosis Date  . Hyperlipidemia   . Allergy   . Migraine   . Unspecified vitamin D deficiency    Past Surgical History  Procedure Laterality Date  . Gynecologic cryosurgery    . Mole removal      multiple benign- Dr. Emily FilbertGould   History  Substance Use Topics  . Smoking status: Never Smoker   . Smokeless tobacco: Not on file  . Alcohol Use: No   Family History  Problem Relation Age of Onset  . Hypertension Mother   . Hyperlipidemia Mother   . Hypothyroidism Mother   . Hypothyroidism Father   . Cancer Maternal Grandfather     spine  . Diabetes Other     juvenille     MAINTENANCE: Colonoscopy: Mammo:10/2012 WNL @ gyn, SOLIS right breast WNL Pap/ Pelvic:2014 WNL EYE:2015, WNL, contacts Dentist:q [phone], due soon DERm: 12/15  IMMUNIZATIONS: Tdap:2006 Influenza: declines  Patient Care Team: Lucky CowboyWilliam McKeown, MD as PCP - General  (Internal Medicine) Turner Danielsavid C Lowe, MD as Consulting Physician (Obstetrics and Gynecology) Rene KocherEliot J Lewit, MD as Consulting Physician (Neurology) Elmon ElseKaren Gould, MD as Consulting Physician (Dermatology) Cephus Richerammi Lynn, (Dentist) Excell Seltzerooper, [facility](Eye)  Review of Systems  Constitutional: Positive for fatigue.  Gastrointestinal:       Reflux  All other systems reviewed and are negative.  BP 106/64 mmHg  Pulse 70  Temp(Src) 98.6 F (37 C) (Temporal)  Resp 18  Wt 160 lb (72.576 kg)     Objective:   Physical Exam  Constitutional: She is oriented to person, place, and time. She appears well-developed and well-nourished. No distress.  HENT:  Head: Normocephalic and atraumatic.  Right Ear: External ear normal.  Left Ear: External ear normal.  Nose: Nose normal.  Mouth/Throat: Oropharynx is clear and moist.  Eyes: Conjunctivae and EOM are normal. Pupils are equal, round, and reactive to light. Right eye exhibits no discharge. Left eye exhibits no discharge. No scleral icterus.  Neck: Normal range of motion. Neck supple. No JVD present. No tracheal deviation present. No thyromegaly present.  Cardiovascular: Normal rate, regular rhythm, normal heart sounds and intact distal pulses.   Pulmonary/Chest: Effort normal and breath sounds normal.  Abdominal: Soft. Bowel sounds are normal. She exhibits no distension and no mass. There is no tenderness. There is no rebound and no guarding.  Genitourinary:  Def gyn  Musculoskeletal: Normal range of motion. She exhibits no edema or tenderness.  Lymphadenopathy:    She has no cervical adenopathy.  Neurological: She is alert and oriented to person, place, and time. She has normal reflexes. No cranial nerve deficit. She exhibits normal muscle tone. Coordination normal.  Skin: Skin is warm and dry. No rash noted. No erythema. No pallor.  Full at derm, exposed areas only  Psychiatric: She has a normal mood and affect. Her behavior is normal. Judgment and thought  content normal.  Nursing note and vitals reviewed.   EKG NSCSPT WNL     Assessment & Plan:  1. CPE- Update screening labs/ History/ Immunizations/ Testing as needed. Advised healthy diet, QD exercise, increase H20 and continue RX/ Vitamins AD.  2. Abnormal Thyroid labs/ Fatigue- check labs, increase activity and H2O. May refer to NEW Endocrinologist pending labs  3. GERD- Diet/ hygiene discussed, Refer GI for further evaluation with failure with treatment and increased symptoms

## 2013-11-20 NOTE — Patient Instructions (Signed)
Gastroesophageal Reflux Disease, Adult Gastroesophageal reflux disease (GERD) happens when acid from your stomach flows up into the esophagus. When acid comes in contact with the esophagus, the acid causes soreness (inflammation) in the esophagus. Over time, GERD may create small holes (ulcers) in the lining of the esophagus. CAUSES   Increased body weight. This puts pressure on the stomach, making acid rise from the stomach into the esophagus.  Smoking. This increases acid production in the stomach.  Drinking alcohol. This causes decreased pressure in the lower esophageal sphincter (valve or ring of muscle between the esophagus and stomach), allowing acid from the stomach into the esophagus.  Late evening meals and a full stomach. This increases pressure and acid production in the stomach.  A malformed lower esophageal sphincter. Sometimes, no cause is found. SYMPTOMS   Burning pain in the lower part of the mid-chest behind the breastbone and in the mid-stomach area. This may occur twice a week or more often.  Trouble swallowing.  Sore throat.  Dry cough.  Asthma-like symptoms including chest tightness, shortness of breath, or wheezing. DIAGNOSIS  Your caregiver may be able to diagnose GERD based on your symptoms. In some cases, X-rays and other tests may be done to check for complications or to check the condition of your stomach and esophagus. TREATMENT  Your caregiver may recommend over-the-counter or prescription medicines to help decrease acid production. Ask your caregiver before starting or adding any new medicines.  HOME CARE INSTRUCTIONS   Change the factors that you can control. Ask your caregiver for guidance concerning weight loss, quitting smoking, and alcohol consumption.  Avoid foods and drinks that make your symptoms worse, such as:  Caffeine or alcoholic drinks.  Chocolate.  Peppermint or mint flavorings.  Garlic and onions.  Spicy foods.  Citrus fruits,  such as oranges, lemons, or limes.  Tomato-based foods such as sauce, chili, salsa, and pizza.  Fried and fatty foods.  Avoid lying down for the 3 hours prior to your bedtime or prior to taking a nap.  Eat small, frequent meals instead of large meals.  Wear loose-fitting clothing. Do not wear anything tight around your waist that causes pressure on your stomach.  Raise the head of your bed 6 to 8 inches with wood blocks to help you sleep. Extra pillows will not help.  Only take over-the-counter or prescription medicines for pain, discomfort, or fever as directed by your caregiver.  Do not take aspirin, ibuprofen, or other nonsteroidal anti-inflammatory drugs (NSAIDs). SEEK IMMEDIATE MEDICAL CARE IF:   You have pain in your arms, neck, jaw, teeth, or back.  Your pain increases or changes in intensity or duration.  You develop nausea, vomiting, or sweating (diaphoresis).  You develop shortness of breath, or you faint.  Your vomit is green, yellow, black, or looks like coffee grounds or blood.  Your stool is red, bloody, or black. These symptoms could be signs of other problems, such as heart disease, gastric bleeding, or esophageal bleeding. MAKE SURE YOU:   Understand these instructions.  Will watch your condition.  Will get help right away if you are not doing well or get worse. Document Released: 10/12/2004 Document Revised: 03/27/2011 Document Reviewed: 07/22/2010 ExitCare Patient Information 2015 [employer]. This information is not intended to replace advice given to you by your health care provider. Make sure you discuss any questions you have with your health care provider.  

## 2013-11-21 ENCOUNTER — Encounter: Payer: Self-pay | Admitting: Internal Medicine

## 2013-11-21 LAB — URINALYSIS, ROUTINE W REFLEX MICROSCOPIC
Bilirubin Urine: NEGATIVE
Glucose, UA: NEGATIVE mg/dL
Hgb urine dipstick: NEGATIVE
Ketones, ur: NEGATIVE mg/dL
Leukocytes, UA: NEGATIVE
Nitrite: NEGATIVE
Protein, ur: NEGATIVE mg/dL
Specific Gravity, Urine: <1.005 — ABNORMAL LOW (ref 1.005–1.030)
Urobilinogen, UA: 0.2 mg/dL (ref 0.0–1.0)
pH: 7 (ref 5.0–8.0)

## 2013-11-21 LAB — LIPID PANEL
Cholesterol: 212 mg/dL — ABNORMAL HIGH (ref 0–200)
HDL: 79 mg/dL (ref >39)
LDL Cholesterol: 113 mg/dL — ABNORMAL HIGH (ref 0–99)
Total CHOL/HDL Ratio: 2.7 Ratio
Triglycerides: 101 mg/dL (ref <150)
VLDL: 20 mg/dL (ref 0–40)

## 2013-11-21 LAB — BASIC METABOLIC PANEL WITH GFR
BUN: 12 mg/dL (ref 6–23)
CO2: 23 mEq/L (ref 19–32)
Calcium: 9.7 mg/dL (ref 8.4–10.5)
Chloride: 104 mEq/L (ref 96–112)
Creat: 0.72 mg/dL (ref 0.50–1.10)
GFR, Est African American: >89 mL/min
GFR, Est Non African American: >89 mL/min
Glucose, Bld: 88 mg/dL (ref 70–99)
Potassium: 4.1 mEq/L (ref 3.5–5.3)
Sodium: 141 mEq/L (ref 135–145)

## 2013-11-21 LAB — T4, FREE: Free T4: 1.01 ng/dL (ref 0.80–1.80)

## 2013-11-21 LAB — THYROID PEROXIDASE ANTIBODY: Thyroperoxidase Ab SerPl-aCnc: 16 IU/mL — ABNORMAL HIGH (ref <9)

## 2013-11-21 LAB — MAGNESIUM: Magnesium: 2 mg/dL (ref 1.5–2.5)

## 2013-11-21 LAB — MICROALBUMIN / CREATININE URINE RATIO
Creatinine, Urine: 28.2 mg/dL
Microalb, Ur: <0.2 mg/dL (ref <2.0)

## 2013-11-21 LAB — HEPATIC FUNCTION PANEL
ALT: 15 U/L (ref 0–35)
AST: 19 U/L (ref 0–37)
Albumin: 4.4 g/dL (ref 3.5–5.2)
Alkaline Phosphatase: 40 U/L (ref 39–117)
Bilirubin, Direct: 0.1 mg/dL (ref 0.0–0.3)
Indirect Bilirubin: 0.3 mg/dL (ref 0.2–1.2)
Total Bilirubin: 0.4 mg/dL (ref 0.2–1.2)
Total Protein: 7.7 g/dL (ref 6.0–8.3)

## 2013-11-21 LAB — INSULIN, FASTING: Insulin fasting, serum: 23 u[IU]/mL — ABNORMAL HIGH (ref 2.0–19.6)

## 2013-11-21 LAB — T3, FREE: T3, Free: 3.6 pg/mL (ref 2.3–4.2)

## 2013-11-21 LAB — VITAMIN D 25 HYDROXY (VIT D DEFICIENCY, FRACTURES): Vit D, 25-Hydroxy: 64 ng/mL (ref 30–89)

## 2013-11-21 LAB — TSH: TSH: 0.868 u[IU]/mL (ref 0.350–4.500)

## 2013-11-24 LAB — TB SKIN TEST
Induration: 0 mm
TB Skin Test: NEGATIVE

## 2013-11-25 ENCOUNTER — Encounter: Payer: Self-pay | Admitting: Emergency Medicine

## 2013-11-25 DIAGNOSIS — K219 Gastro-esophageal reflux disease without esophagitis: Secondary | ICD-10-CM

## 2013-11-25 HISTORY — DX: Gastro-esophageal reflux disease without esophagitis: K21.9

## 2013-12-02 ENCOUNTER — Other Ambulatory Visit: Payer: Self-pay | Admitting: Obstetrics and Gynecology

## 2013-12-04 ENCOUNTER — Encounter: Payer: Self-pay | Admitting: *Deleted

## 2013-12-04 LAB — CYTOLOGY - PAP

## 2013-12-06 ENCOUNTER — Encounter: Payer: Self-pay | Admitting: *Deleted

## 2013-12-06 DIAGNOSIS — N6311 Unspecified lump in the right breast, upper outer quadrant: Secondary | ICD-10-CM

## 2013-12-06 DIAGNOSIS — N644 Mastodynia: Secondary | ICD-10-CM

## 2013-12-29 ENCOUNTER — Encounter: Payer: Self-pay | Admitting: *Deleted

## 2014-01-01 ENCOUNTER — Encounter: Payer: Self-pay | Admitting: Internal Medicine

## 2014-01-01 ENCOUNTER — Ambulatory Visit (INDEPENDENT_AMBULATORY_CARE_PROVIDER_SITE_OTHER): Payer: BC Managed Care – PPO | Admitting: Internal Medicine

## 2014-01-01 VITALS — BP 116/68 | HR 84 | Ht 64.75 in | Wt 159.2 lb

## 2014-01-01 DIAGNOSIS — R1013 Epigastric pain: Secondary | ICD-10-CM

## 2014-01-01 DIAGNOSIS — R6881 Early satiety: Secondary | ICD-10-CM

## 2014-01-01 DIAGNOSIS — J387 Other diseases of larynx: Secondary | ICD-10-CM

## 2014-01-01 DIAGNOSIS — K219 Gastro-esophageal reflux disease without esophagitis: Secondary | ICD-10-CM

## 2014-01-01 MED ORDER — PANTOPRAZOLE SODIUM 40 MG PO TBEC: 40.0000 mg | DELAYED_RELEASE_TABLET | Freq: Every day | ORAL | Status: DC

## 2014-01-01 NOTE — Patient Instructions (Signed)
You have been scheduled for an endoscopy. Please follow written instructions given to you at your visit today. If you use inhalers (even only as needed), please bring them with you on the day of your procedure. Your physician has requested that you go to www.startemmi.com and enter the access code given to you at your visit today. This web site gives a general overview about your procedure. However, you should still follow specific instructions given to you by our office regarding your preparation for the procedure.  We have sent the following medications to your pharmacy for you to pick up at your convenience: Protonix 40 mg daily  Please purchase the following medications over the counter and take as directed: Pepcid or Zantac at night as needed  Please continue to follow antireflux measures.  CC:Dr Hewlett-PackardMcKeown

## 2014-01-01 NOTE — Progress Notes (Signed)
Patient ID: Shawna Forbes, female   DOB: 1970-06-07, 43 y.o.   MRN: [id number] HPI: Shawna Forbes is a 43 year old female with a past medical history of GERD, H. pylori antibody positive status post treatment, migraine and hyperlipidemia who is seen in consultation at the request of Shawna Fee, PA-C to evaluate GERD, epigastric burning pain with early satiety and cough with throat clearing. She is here alone today. She reports symptoms seemed to start around 2014 when she was treated for bronchitis with a Z-Pak. This didn't help and she was then treated with levofloxacin. Sometime around this time she was tested for H. pylori by antibody which was found to be positive and treated with therapy. After this she had worsening heartburn symptoms as well as coughing, burning in her chest and stomach, early satiety. She was treated with Dexilant 60 mg daily for almost a year. This worked incredibly well and all of her symptoms resolved. Her insurance company is no longer paying for this medication so she has been off this drug since October. She did try over-the-counter Zegerid without benefit. Most of her symptoms have returned including daily heartburn, coughing if she overeats, early satiety and mild nausea. No vomiting. No dysphagia or odynophagia. No weight loss in fact some weight gain. She is eating small more frequent meals. She is sleeping propped up on multiple pillows. No lower abdominal pain, change in bowel movement, blood in her stool or melena. She states she performed H. pylori stool antigen after treatment and this was negative  Family history of IBS and her mother and her mother had colon polyps, benign, at screening colonoscopy  Past Medical History  Diagnosis Date  . Hyperlipidemia   . Allergy   . Migraine   . Unspecified vitamin D deficiency   . GERD (gastroesophageal reflux disease) 11/25/2013  . Depression   . Helicobacter pylori ab+     Past Surgical History  Procedure  Laterality Date  . Gynecologic cryosurgery    . Mole removal      multiple benign- Dr. Emily Filbert    Outpatient Prescriptions Prior to Visit  Medication Sig Dispense Refill  . Ascorbic Acid (VITAMIN C) 1000 MG tablet Take 1,000 mg by mouth daily.    Marland Kitchen aspirin 81 MG tablet Take 81 mg by mouth daily.    . calcium carbonate (OS-CAL) 600 MG TABS tablet Take 600 mg by mouth 2 (two) times daily with a meal.    . eletriptan (RELPAX) 40 MG tablet Take 1 tablet (40 mg total) by mouth as needed for migraine or headache. One tablet by mouth at onset of headache. May repeat in 2 hours if headache persists or recurs. 10 tablet 1  . Magnesium 250 MG TABS Take by mouth.    . Multiple Vitamins-Minerals (MULTIVITAMIN WITH MINERALS) tablet Take 1 tablet by mouth. Three times per week    . naproxen sodium (ANAPROX) 220 MG tablet Take 220 mg by mouth 2 (two) times daily with a meal.    . Red Yeast Rice 600 MG CAPS Take 600 mg by mouth 2 (two) times daily.    . Vitamin D, Cholecalciferol, 1000 UNITS TABS Take 1,000 Units by mouth. Three times per week    . dexlansoprazole (DEXILANT) 60 MG capsule Take 1 capsule (60 mg total) by mouth daily. 30 capsule 5   No facility-administered medications prior to visit.    Allergies  Allergen Reactions  . Tetracyclines & Related     Hives    Family  History  Problem Relation Age of Onset  . Hypertension Mother   . Hyperlipidemia Mother   . Hypothyroidism Mother   . Hypothyroidism Father   . Cancer Maternal Grandfather     spine  . Diabetes Other     juvenille  . Colon cancer Neg Hx   . Colon polyps Mother   . Irritable bowel syndrome Mother   . Heart disease Maternal Grandfather   . Kidney disease Neg Hx   . Gallbladder disease Neg Hx   . Esophageal cancer Neg Hx     History  Substance Use Topics  . Smoking status: Never Smoker   . Smokeless tobacco: Never Used  . Alcohol Use: 0.0 oz/week    0 Not specified per week     Comment: Socially    ROS: As  per history of present illness, otherwise negative  BP 116/68 mmHg  Pulse 84  Ht 5' 4.75" (1.645 m)  Wt 159 lb 4 oz (72.235 kg)  BMI 26.69 kg/m2 Constitutional: Well-developed and well-nourished. No distress. HEENT: Normocephalic and atraumatic. Oropharynx is clear and moist. No oropharyngeal exudate. Conjunctivae are normal.  No scleral icterus. Cardiovascular: Normal rate, regular rhythm and intact distal pulses. No M/R/G Pulmonary/chest: Effort normal and breath sounds normal. No wheezing, rales or rhonchi. Abdominal: Soft, epigastric tenderness without rebound or guarding, nondistended. Bowel sounds active throughout. There are no masses palpable. No hepatosplenomegaly. Extremities: no clubbing, cyanosis, or edema  Neurological: Alert and orienLymphadenopathy: No cervical adenopathy noted.ted to person place and time. Skin: Skin is warm and dry. No rashes noted. Psychiatric: Normal mood and affect. Behavior is normal.  RELEVANT LABS AND IMAGING: CBC    Component Value Date/Time   WBC 7.1 11/20/2013 1715   RBC 4.71 11/20/2013 1715   HGB 14.1 11/20/2013 1715   HCT 41.3 11/20/2013 1715   PLT 312 11/20/2013 1715   MCV 87.7 11/20/2013 1715   MCH 29.9 11/20/2013 1715   MCHC 34.1 11/20/2013 1715   RDW 13.0 11/20/2013 1715   LYMPHSABS 3.2 11/20/2013 1715   MONOABS 0.4 11/20/2013 1715   EOSABS 0.1 11/20/2013 1715   BASOSABS 0.1 11/20/2013 1715    CMP     Component Value Date/Time   NA 141 11/20/2013 1715   K 4.1 11/20/2013 1715   CL 104 11/20/2013 1715   CO2 23 11/20/2013 1715   GLUCOSE 88 11/20/2013 1715   BUN 12 11/20/2013 1715   CREATININE 0.72 11/20/2013 1715   CALCIUM 9.7 11/20/2013 1715   PROT 7.7 11/20/2013 1715   ALBUMIN 4.4 11/20/2013 1715   AST 19 11/20/2013 1715   ALT 15 11/20/2013 1715   ALKPHOS 40 11/20/2013 1715   BILITOT 0.4 11/20/2013 1715   GFRNONAA >89 11/20/2013 1715   GFRAA >89 11/20/2013 1715    ASSESSMENT/PLAN: 43 year old female with a past  medical history of GERD, H. pylori antibody positive status post treatment, migraine and hyperlipidemia who is seen in consultation at the request of Shawna FeeMelissa Smith, PA-C to evaluate GERD, epigastric burning pain with early satiety and cough with throat clearing.  1. GERD, epigastric pain, early satiety, LPR -- she has symptoms consistent with LPR and GERD but also gastric symptoms which could be consistent with gastritis or ulcer disease. I recommended reinitiation with PPI in the form of pantoprazole 40 mg daily. She is asked to take this 30 minutes before her first meal of the day. She can use over-the-counter Pepcid or Zantac per box instruction for breakthrough heartburn in the evenings if  necessary only. We discussed trial of PPI followed by EGD if not response, but she would prefer EGD at present. This is reasonable given early satiety and epigastric pain. We discussed the test including the risks and benefits and she is agreeable to proceed. She should continue to follow antireflux measures and diet. If pantoprazole 40 mg daily is ineffective for heartburn and the above-mentioned symptoms I would recommend switching back to Dexilant 60 mg daily which previously worked very well.  2. CRC screening -- rec age 43

## 2014-01-21 ENCOUNTER — Ambulatory Visit (AMBULATORY_SURGERY_CENTER): Payer: BC Managed Care – PPO | Admitting: Internal Medicine

## 2014-01-21 ENCOUNTER — Encounter: Payer: Self-pay | Admitting: Internal Medicine

## 2014-01-21 VITALS — BP 100/67 | HR 73 | Temp 98.0°F | Resp 17 | Ht 64.0 in | Wt 159.0 lb

## 2014-01-21 DIAGNOSIS — K2100 Gastro-esophageal reflux disease with esophagitis, without bleeding: Secondary | ICD-10-CM

## 2014-01-21 DIAGNOSIS — R1013 Epigastric pain: Secondary | ICD-10-CM

## 2014-01-21 DIAGNOSIS — K2950 Unspecified chronic gastritis without bleeding: Secondary | ICD-10-CM

## 2014-01-21 DIAGNOSIS — K219 Gastro-esophageal reflux disease without esophagitis: Secondary | ICD-10-CM

## 2014-01-21 DIAGNOSIS — R6881 Early satiety: Secondary | ICD-10-CM

## 2014-01-21 MED ORDER — SODIUM CHLORIDE 0.9 % IV SOLN: 500.0000 mL | INTRAVENOUS | Status: DC

## 2014-01-21 MED ORDER — DEXLANSOPRAZOLE 60 MG PO CPDR
60.0000 mg | DELAYED_RELEASE_CAPSULE | Freq: Every day | ORAL | Status: DC
Start: 2014-01-21 — End: 2014-04-02

## 2014-01-21 NOTE — Op Note (Signed)
[facility] [address] [place], [id number]   ENDOSCOPY PROCEDURE REPORT  PATIENT: Shawna Forbes, Shawna Forbes  MR#: [id number] BIRTHDATE: May 31, 1970 , 43  yrs. old GENDER: female ENDOSCOPIST: Beverley FiedlerJay M Pyrtle, MD REFERRED BY:  Loree FeeMelissa Smith, PA-C PROCEDURE DATE:  01/21/2014 PROCEDURE:  EGD w/ biopsy ASA CLASS:     Class II INDICATIONS:  heartburn, epigastric pain, and early satiety. MEDICATIONS: Monitored anesthesia care and Propofol 400 mg IV TOPICAL ANESTHETIC: none  DESCRIPTION OF PROCEDURE: After the risks benefits and alternatives of the procedure were thoroughly explained, informed consent was obtained.  The LB [id number] endoscope was introduced through the mouth and advanced to the second portion of the duodenum , Without limitations.  The instrument was slowly withdrawn as the mucosa was fully examined.  ESOPHAGUS: There was LA Class A reflux esophagitis (One or more mucosal breaks < 5 mm in maximal length) noted.   The esophagus was otherwise normal.  STOMACH: The mucosa of the stomach appeared normal.  Cold forcep biopsies were taken at the gastric body, antrum and angularis to evaluate for h.  pylori.  DUODENUM: The duodenal mucosa showed no abnormalities in the bulb and 2nd part of the duodenum.  Cold forceps biopsies were taken in the bulb and second portion. Retroflexed views revealed no abnormalities.     The scope was then withdrawn from the patient and the procedure completed.  COMPLICATIONS: There were no immediate complications.  ENDOSCOPIC IMPRESSION: 1.   There was LA Class A reflux esophagitis noted 2.   The esophagus was otherwise normal. 3.   The mucosa of the stomach appeared normal; multiple biopsies 4.   The duodenal mucosa showed no abnormalities in the bulb and 2nd part of the duodenum cold forceps biopsies were taken in the bulb and second portion  RECOMMENDATIONS: 1.  Await biopsy results 2.  Change PPI to Dexiant 60 mg  daily 3.  Office follow-up next available  eSigned:  Beverley FiedlerJay M Pyrtle, MD 01/21/2014 3:29 PM CC:The Patient, PCP

## 2014-01-21 NOTE — Progress Notes (Signed)
A/ox3, pleased with MAC, report to RN 

## 2014-01-21 NOTE — Progress Notes (Signed)
Called to room to assist during endoscopic procedure.  Patient ID and intended procedure confirmed with present staff. Received instructions for my participation in the procedure from the performing physician.  

## 2014-01-21 NOTE — Patient Instructions (Signed)

## 2014-01-22 ENCOUNTER — Telehealth: Payer: Self-pay | Admitting: *Deleted

## 2014-01-22 ENCOUNTER — Telehealth: Payer: Self-pay | Admitting: Internal Medicine

## 2014-01-22 NOTE — Telephone Encounter (Signed)
  Follow up Call-  Call back number 01/21/2014  Post procedure Call Back phone  # [phone]  Permission to leave phone message Yes     Patient questions:  Do you have a fever, pain , or abdominal swelling? No. Pain Score  0 *  Have you tolerated food without any problems? Yes.    Have you been able to return to your normal activities? Yes.    Do you have any questions about your discharge instructions: Diet   No. Medications  No. Follow up visit  No.  Do you have questions or concerns about your Care? No.  Actions: * If pain score is 4 or above: No action needed, pain <4.

## 2014-01-22 NOTE — Telephone Encounter (Signed)
Prior auth initiated on [email or web address] 

## 2014-01-22 NOTE — Telephone Encounter (Signed)
Initiated prior authorization through [email or web address]. (see conversation below)  ME: could you help me to figure out what the problem with my form (Key: [id number]) is? I sent demographics to plan but now it tells me it was never sent.  Nate C.: hi!  Nate C.: Try pressing the F5 key on your keyboard to refresh the page.  Nate C.: [employer] has responded to this one saying there was already PA submitted for this same patient and drug and that it was denied. The Case ID is [id number] if you wanted to call and appeal at [phone].   Dr Rhea BeltonPyrtle, please advise... It may I give her omeprazole to see if that is covered?

## 2014-01-29 NOTE — Telephone Encounter (Signed)
Patient had nonresponse to pantoprazole, Zegerid and prior very positive response to Dexilant

## 2014-01-29 NOTE — Telephone Encounter (Signed)
Patient advised that per my conversation with Dr Rhea BeltonPyrtle, her pathology from EGD was benign. She unfortunately is not on a PPI right now due to insurance denial of Dexilant and ineffectiveness of pantoprazole and zegerid.   I have contacted [employer] again and was told that I would have to place an appeal. I waited on the phone for over 30 minutes and was hung up on. I will try again at a later time.

## 2014-01-30 MED ORDER — RABEPRAZOLE SODIUM 20 MG PO TBEC: 20.0000 mg | DELAYED_RELEASE_TABLET | Freq: Two times a day (BID) | ORAL | Status: DC

## 2014-01-30 NOTE — Telephone Encounter (Signed)
Per [employer], (after being on hold for 30+ minutes) the appeal representative has advised that a phone appeal cannot be completed. A letter will need to be sent to appeals department. Since patient has already been without medication for some time, Dr Rhea BeltonPyrtle has okayed a script of Aciphex 20 mg twice daily until we can get an appeal response. I have left a message advising this.

## 2014-02-03 ENCOUNTER — Encounter: Payer: Self-pay | Admitting: Internal Medicine

## 2014-02-04 NOTE — Telephone Encounter (Signed)
Appeal letter sent to patient's insurance.

## 2014-02-10 NOTE — Telephone Encounter (Signed)
Patient's insurance has approaved Rabeprazole for now from 01/03/14-02/02/15. Case ID [id number]. I am still waiting on a response from patient's insurance regarding Dexilant appeal letter that was sent on 02/04/14.

## 2014-02-27 NOTE — Telephone Encounter (Signed)
Called [employer] appeals department and was on hold 2+ hours. When I asked about the decision for appeal on Dexilant, I was advised that "no appeal is found." I will now attempt to FAX the appeal since apparently the mail version never went through. Fax [phone] additional address [address] [place] [id number].

## 2014-02-28 ENCOUNTER — Other Ambulatory Visit: Payer: Self-pay | Admitting: Internal Medicine

## 2014-03-13 ENCOUNTER — Ambulatory Visit: Payer: BC Managed Care – PPO | Admitting: Internal Medicine

## 2014-03-16 NOTE — Telephone Encounter (Signed)
I have contacted patient and she states that [employer] actually has sent her a decision regarding appeal for Dexilant. They have still denied Rx. Patient is currently on Aciphex and has had minimal improvement in symptoms. Patient states that she is trying to change her diet, lose weight etc. I have apologized that it has taken so long to get an answer [facility]. Patient is very nice and understanding about this.

## 2014-04-02 ENCOUNTER — Ambulatory Visit (INDEPENDENT_AMBULATORY_CARE_PROVIDER_SITE_OTHER): Payer: BC Managed Care – PPO | Admitting: Physician Assistant

## 2014-04-02 ENCOUNTER — Encounter: Payer: Self-pay | Admitting: Physician Assistant

## 2014-04-02 VITALS — BP 110/70 | HR 60 | Temp 98.2°F | Resp 16 | Ht 64.75 in | Wt 159.0 lb

## 2014-04-02 DIAGNOSIS — E041 Nontoxic single thyroid nodule: Secondary | ICD-10-CM

## 2014-04-02 DIAGNOSIS — E8881 Metabolic syndrome: Secondary | ICD-10-CM

## 2014-04-02 DIAGNOSIS — K2100 Gastro-esophageal reflux disease with esophagitis, without bleeding: Secondary | ICD-10-CM

## 2014-04-02 DIAGNOSIS — E559 Vitamin D deficiency, unspecified: Secondary | ICD-10-CM

## 2014-04-02 DIAGNOSIS — E782 Mixed hyperlipidemia: Secondary | ICD-10-CM

## 2014-04-02 LAB — LIPID PANEL
Cholesterol: 204 mg/dL — ABNORMAL HIGH (ref 0–200)
HDL: 82 mg/dL (ref >=46)
LDL Cholesterol: 106 mg/dL — ABNORMAL HIGH (ref 0–99)
Total CHOL/HDL Ratio: 2.5 Ratio
Triglycerides: 80 mg/dL (ref <150)
VLDL: 16 mg/dL (ref 0–40)

## 2014-04-02 LAB — T3, FREE: T3, Free: 3.4 pg/mL (ref 2.3–4.2)

## 2014-04-02 LAB — T4, FREE: Free T4: 0.99 ng/dL (ref 0.80–1.80)

## 2014-04-02 LAB — TSH: TSH: 0.774 u[IU]/mL (ref 0.350–4.500)

## 2014-04-02 NOTE — Progress Notes (Signed)
Assessment and Plan:  1.  Cholesterol -Continue diet and exercise. Check cholesterol.   2. Insulin resistance, normal A1C -Continue diet and exercise. Check insulin  3. GERD Continue diet and can take H2.  4. Elevated thyroid antibody with strong family history and history of thyroiditis - check level and continue medications.   Continue diet and meds as discussed. Further disposition pending results of labs.  HPI 44 y.o. female  presents for 4 month follow up for GERD, chol, migraines, and vitamin D def.    Her blood pressure has been controlled at home, today their BP is BP: 110/70 mmHg  She has changed her diet which has helped her GERD and her thyroid as well, no pain since last summer.   She does not workout. She denies chest pain, shortness of breath, dizziness.  She is not on cholesterol medication and denies myalgias. Her cholesterol is not at goal, she was not fasting last time. The cholesterol last visit was:   Lab Results  Component Value Date   CHOL 212* 11/20/2013   HDL 79 11/20/2013   LDLCALC 113* 11/20/2013   TRIG 101 11/20/2013   CHOLHDL 2.7 11/20/2013   Last Z6X in the office was:  Lab Results  Component Value Date   HGBA1C 5.0 11/20/2013  Patient is on Vitamin D supplement.   Lab Results  Component Value Date   VD25OH 64 11/20/2013   Current Medications:  Current Outpatient Prescriptions on File Prior to Visit  Medication Sig Dispense Refill  . Ascorbic Acid (VITAMIN C) 1000 MG tablet Take 1,000 mg by mouth daily.    Marland Kitchen aspirin 81 MG tablet Take 81 mg by mouth daily.    . calcium carbonate (OS-CAL) 600 MG TABS tablet Take 600 mg by mouth 2 (two) times daily with a meal.    . diphenhydrAMINE (BENADRYL) 50 MG capsule Take 50 mg by mouth every 6 (six) hours as needed for sleep.    Marland Kitchen eletriptan (RELPAX) 40 MG tablet Take 1 tablet (40 mg total) by mouth as needed for migraine or headache. One tablet by mouth at onset of headache. May repeat in 2 hours if  headache persists or recurs. 10 tablet 1  . Magnesium 250 MG TABS Take by mouth.    . Multiple Vitamins-Minerals (MULTIVITAMIN WITH MINERALS) tablet Take 1 tablet by mouth. Three times per week    . Red Yeast Rice 600 MG CAPS Take 600 mg by mouth 2 (two) times daily.    . Vitamin D, Cholecalciferol, 1000 UNITS TABS Take 1,000 Units by mouth. Three times per week     No current facility-administered medications on file prior to visit.   Medical History:  Past Medical History  Diagnosis Date  . Hyperlipidemia   . Allergy   . Migraine   . Unspecified vitamin D deficiency   . GERD (gastroesophageal reflux disease) 11/25/2013  . Depression   . Helicobacter pylori ab+    Allergies:  Allergies  Allergen Reactions  . Tetracyclines & Related     Hives     Review of Systems:  Review of Systems  Constitutional: Negative.   HENT: Negative.   Eyes: Negative.   Respiratory: Negative.   Cardiovascular: Negative.   Gastrointestinal: Negative.   Genitourinary: Negative.   Musculoskeletal: Negative.   Skin: Negative.   Neurological: Negative.   Endo/Heme/Allergies: Negative.   Psychiatric/Behavioral: Negative.     Family history- Review and unchanged Social history- Review and unchanged Physical Exam: BP 110/70 mmHg  Pulse 60  Temp(Src) 98.2 F (36.8 C)  Resp 16  Ht 5' 4.75" (1.645 m)  Wt 159 lb (72.122 kg)  BMI 26.65 kg/m2 Wt Readings from Last 3 Encounters:  04/02/14 159 lb (72.122 kg)  01/21/14 159 lb (72.122 kg)  01/01/14 159 lb 4 oz (72.235 kg)   General Appearance: Well nourished, in no apparent distress. Eyes: PERRLA, EOMs, conjunctiva no swelling or erythema Sinuses: No Frontal/maxillary tenderness ENT/Mouth: Ext aud canals clear, TMs without erythema, bulging. No erythema, swelling, or exudate on post pharynx.  Tonsils not swollen or erythematous. Hearing normal.  Neck: Supple, thyroid normal.  Respiratory: Respiratory effort normal, BS equal bilaterally without  rales, rhonchi, wheezing or stridor.  Cardio: RRR with no MRGs. Brisk peripheral pulses without edema.  Abdomen: Soft, + BS,  Non tender, no guarding, rebound, hernias, masses. Lymphatics: Non tender without lymphadenopathy.  Musculoskeletal: Full ROM, 5/5 strength, Normal gait Skin: Warm, dry without rashes, lesions, ecchymosis.  Neuro: Cranial nerves intact. Normal muscle tone, no cerebellar symptoms. Psych: Awake and oriented X 3, normal affect, Insight and Judgment appropriate.    Quentin Mullingollier, Amanda, PA-C 9:00 AM [facility]

## 2014-04-03 LAB — THYROID PEROXIDASE ANTIBODY: Thyroperoxidase Ab SerPl-aCnc: 30 IU/mL — ABNORMAL HIGH (ref <9)

## 2014-04-03 LAB — INSULIN, FASTING: Insulin fasting, serum: 1.8 u[IU]/mL — ABNORMAL LOW (ref 2.0–19.6)

## 2014-04-15 ENCOUNTER — Encounter: Payer: Self-pay | Admitting: *Deleted

## 2014-04-28 ENCOUNTER — Ambulatory Visit (INDEPENDENT_AMBULATORY_CARE_PROVIDER_SITE_OTHER): Payer: BC Managed Care – PPO | Admitting: Internal Medicine

## 2014-04-28 ENCOUNTER — Encounter: Payer: Self-pay | Admitting: Internal Medicine

## 2014-04-28 VITALS — BP 104/70 | HR 80 | Ht 64.75 in | Wt 160.4 lb

## 2014-04-28 DIAGNOSIS — K2100 Gastro-esophageal reflux disease with esophagitis, without bleeding: Secondary | ICD-10-CM

## 2014-04-28 NOTE — Patient Instructions (Signed)
Please follow up with Dr Rhea BeltonPyrtle as needed.  Continue to take Pepcid as needed.

## 2014-04-28 NOTE — Progress Notes (Signed)
   Subjective:    Patient ID: Shawna Forbes, female    DOB: 1970-09-23, 44 y.o.   MRN: [id number]  HPI Shawna Forbes is a 44 year old female with a past medical history of GERD, H. pylori antibody positive status post treatment, migraines and hyperlipidemia who seen in follow-up. She was initially seen to evaluate GERD, epigastric pain, early satiety and cough with throat clearing. Upper endoscopy is recommended and was performed on 01/21/2014. This revealed LA class a reflux esophagitis, normal-appearing stomach and duodenum. Small bowel biopsies were normal. Gastric biopsies showed mild chronic inactive gastritis negative for H. pylori, metaplasia, dysplasia or malignancy. Pantoprazole was recommended for reflux esophagitis  She reports she used the pantoprazole and it caused severe abdominal bloating. We tried other medications but had very difficult time getting them approved by insurance. She reported she made the decision to dramatically change her diet, begin exercise, and avoid reflux producing foods. She's avoided alcohol, chocolate, caffeine. She is eating smaller more frequent meals. She is also avoiding processed foods. For her this is been dramatically beneficial. She's had much less reflux. No abdominal bloating and no early satiety. Epigastric abdominal pain has resolved. She is using famotidine 20 mg on an as-needed basis.   Review of Systems As per history of present illness, otherwise negative  Current Medications, Allergies, Past Medical History, Past Surgical History, Family History and Social History were reviewed in [facility] electronic [facility] record.     Objective:   Physical Exam BP 104/70 mmHg  Pulse 80  Ht 5' 4.75" (1.645 m)  Wt 160 lb 6 oz (72.746 kg)  BMI 26.88 kg/m2 Constitutional: Well-developed and well-nourished. No distress. HEENT: Normocephalic and atraumatic. Marland Kitchen. Conjunctivae are normal.  No scleral icterus. Neck: Neck supple. Trachea  midline. Cardiovascular: Normal rate, regular rhythm and intact distal pulses. No M/R/G Pulmonary/chest: Effort normal and breath sounds normal. No wheezing, rales or rhonchi. Abdominal: Soft, nontender, nondistended. Bowel sounds active throughout. There are no masses palpable. No hepatosplenomegaly. Extremities: no clubbing, cyanosis, or edema Neurological: Alert and oriented to person place and time. Psychiatric: Normal mood and affect. Behavior is normal.  EGD reviewed including with the patient      Assessment & Plan:   44 year old female with a past medical history of GERD, H. pylori antibody positive status post treatment, migraines and hyperlipidemia who seen in follow-up  1. GERD -- dramatic improvement with weight loss, dietary modification and lifestyle changes. She is congratulated on the positive effect she's been able to make with diet and exercise. She will continue these measures. Famotidine can be used on an as-needed basis, 20 mg twice a day. She is asked to notify me should she develop worsening reflux symptoms, dysphagia, or recurrent abdominal pain. She voices understanding. She is happy with this plan  Return as needed 15 minutes spent with the patient today, greater than 50% discussing the above issues

## 2014-05-25 ENCOUNTER — Ambulatory Visit (INDEPENDENT_AMBULATORY_CARE_PROVIDER_SITE_OTHER): Payer: BC Managed Care – PPO | Admitting: Internal Medicine

## 2014-05-25 VITALS — BP 122/70 | HR 92 | Temp 99.8°F | Resp 16 | Ht 64.75 in | Wt 161.0 lb

## 2014-05-25 DIAGNOSIS — J069 Acute upper respiratory infection, unspecified: Secondary | ICD-10-CM

## 2014-05-25 MED ORDER — PHENYLEPH-PROMETHAZINE-COD 5-6.25-10 MG/5ML PO SYRP: 5.0000 mL | ORAL_SOLUTION | Freq: Every evening | ORAL | Status: DC | PRN

## 2014-05-25 MED ORDER — PREDNISONE 20 MG PO TABS: ORAL_TABLET | ORAL | Status: DC

## 2014-05-25 MED ORDER — BENZONATATE 100 MG PO CAPS: 100.0000 mg | ORAL_CAPSULE | Freq: Four times a day (QID) | ORAL | Status: DC | PRN

## 2014-05-25 MED ORDER — AZITHROMYCIN 250 MG PO TABS: ORAL_TABLET | ORAL | Status: DC

## 2014-05-25 NOTE — Patient Instructions (Signed)

## 2014-05-25 NOTE — Progress Notes (Signed)
Patient ID: Shawna Forbes, female   DOB: 1970-06-02, 44 y.o.   MRN: [id number]  HPI  Patient presents to the office for evaluation of cough.  It has been going on for 1 weeks.  Patient reports night > day, wet, paroxysmal, worse with lying down.  They also endorse change in voice, chills, fever, postnasal drip and nasal congestion, sinus pressure, bilateral ear pain, sore throat, headache..  They have tried nyquil and dayquil.  They report that nothing has worked.  They admits to other sick contacts at work and her children also have similar symptoms.  She does not have seasonal allergies.  Review of Systems  Constitutional: Positive for fever, chills and malaise/fatigue.  HENT: Positive for congestion, ear pain, hearing loss and sore throat.   Respiratory: Positive for cough and sputum production. Negative for shortness of breath and wheezing.   Cardiovascular: Negative for chest pain, palpitations and leg swelling.  Gastrointestinal: Negative for nausea and vomiting.  Skin: Negative.   Neurological: Positive for headaches.    PE:  General:  Alert and non-toxic, WDWN, NAD HEENT: NCAT, PERLA, EOM normal, no occular discharge or erythema.  Nasal mucosal edema with sinus tenderness to palpation.  Oropharynx clear with minimal oropharyngeal edema and erythema.  Mucous membranes moist and pink. Neck:  Cervical adenopathy Chest:  RRR no MRGs.  Lungs clear to auscultation A&P with no wheezes rhonchi or rales.   Abdomen: +BS x 4 quadrants, soft, non-tender, no guarding, rigidity, or rebound. Skin: warm and dry no rash Neuro: A&Ox4, CN II-XII grossly intact  Assessment and Plan:   1. Acute URI -nasal saline -mucinex -tylenol prn -cepacol - azithromycin (ZITHROMAX Z-PAK) 250 MG tablet; 2 po day one, then 1 daily x 4 days  Dispense: 5 tablet; Refill: 0 - predniSONE (DELTASONE) 20 MG tablet; 3 tabs po day one, then 2 tabs daily x 4 days  Dispense: 11 tablet; Refill: 0 - benzonatate  (TESSALON PERLES) 100 MG capsule; Take 1 capsule (100 mg total) by mouth every 6 (six) hours as needed for cough.  Dispense: 30 capsule; Refill: 1 - Phenyleph-Promethazine-Cod 5-6.25-10 MG/5ML SYRP; Take 5 mLs by mouth at bedtime as needed (For severe coughing).  Dispense: 180 mL; Refill: 0

## 2014-06-24 NOTE — Addendum Note (Signed)
Addended by: Terri PiedraFORCUCCI, COURTNEY A on: 06/24/2014 01:48 PM   Modules accepted: Kipp BroodSmartSet

## 2014-08-31 IMAGING — US US THYROID BIOPSY
1 series · 5 of 5 positions shown · non-contrast
Comparison: 07/22/2012

CLINICAL DATA: Dominant 2 cm right thyroid nodule

ULTRASOUND GUIDED NEEDLE ASPIRATE BIOPSY OF THE THYROID GLAND

[Series 1: us thyroid biopsy · 5 acquisitions, 5 frames shown]
[im 1/5]
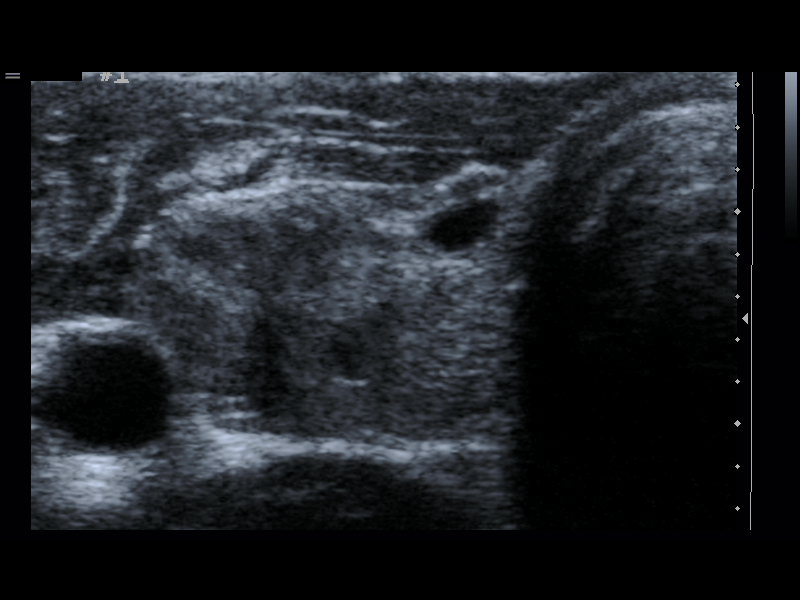
[im 2/5]
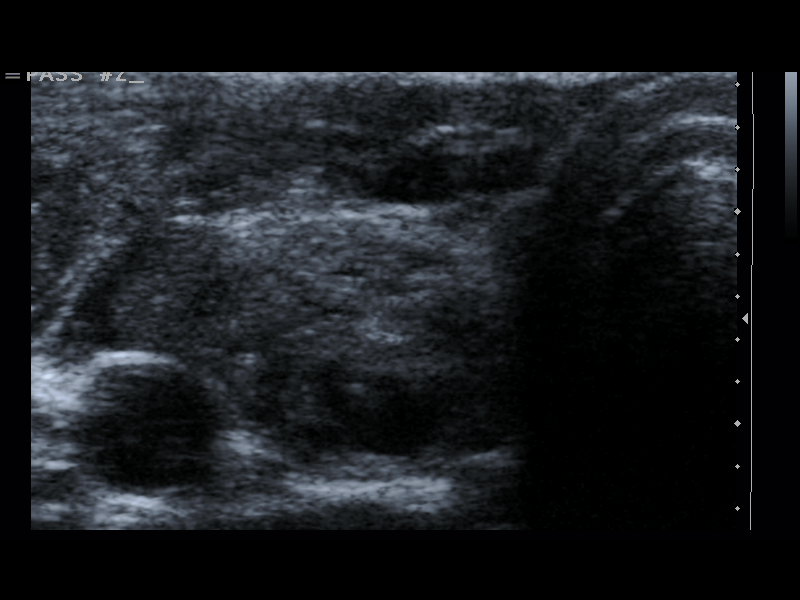
[im 3/5]
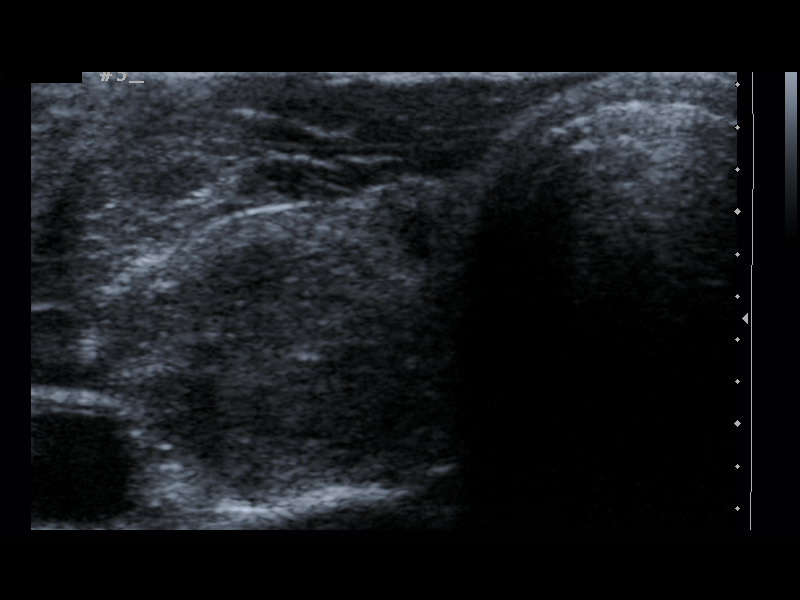
[im 4/5]
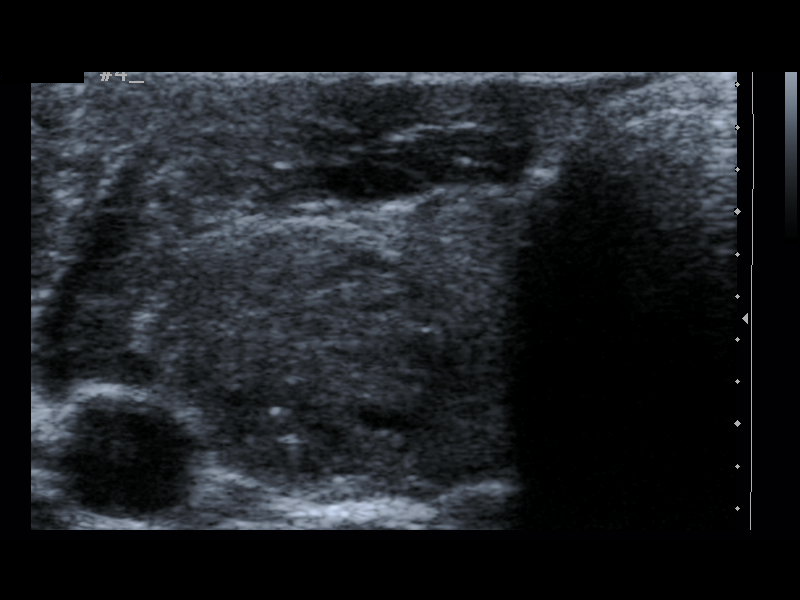
[im 5/5]
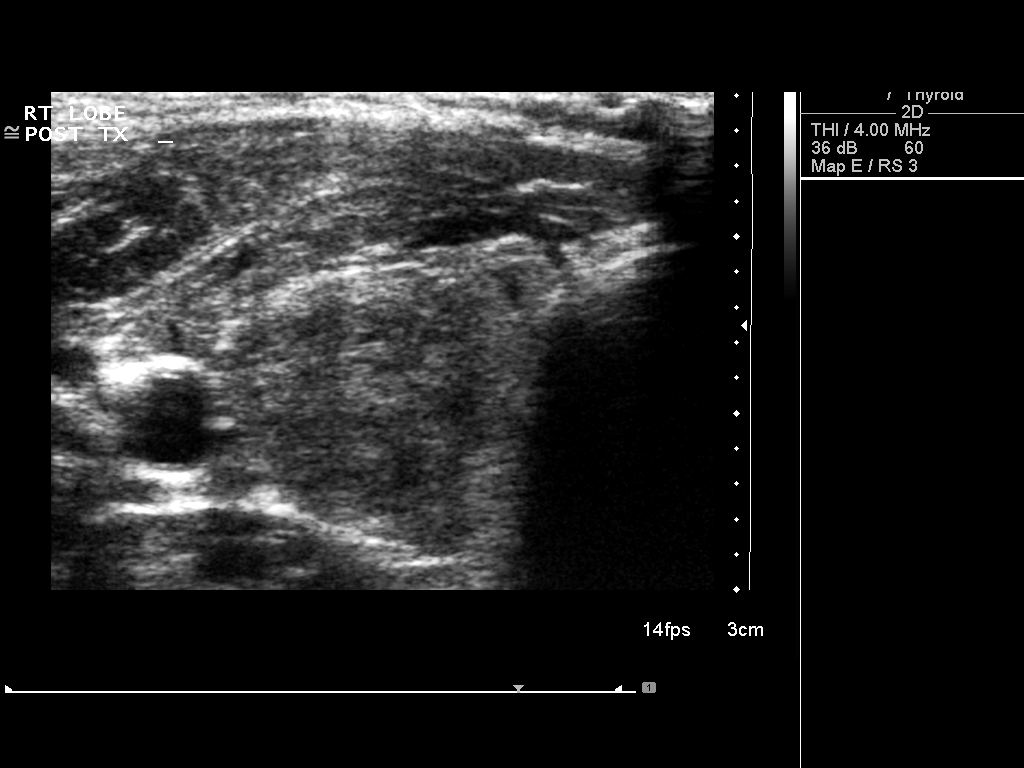

[5 of 5 positions shown; findings below may reference images not displayed]

Thyroid biopsy was thoroughly discussed with the patient and
questions were answered.  The benefits, risks, alternatives, and
complications were also discussed.  The patient understands and
wishes to proceed with the procedure.  Written consent was
obtained.

Ultrasound was performed to localize and mark an adequate site for
the biopsy.  The patient was then prepped and draped in a normal
sterile fashion.  Local anesthesia was provided with 1% lidocaine.
Using direct ultrasound guidance, 4 passes were made using 25 gauge
needles into the nodule within the right lobe of the thyroid.
Ultrasound was used to confirm needle placements on all occasions.
Specimens were sent to Pathology for analysis.

Complications:  No immediate
FINDINGS: Imaging confirms needle placed in the dominant right
inferior thyroid nodule
IMPRESSION: Ultrasound guided needle aspirate biopsy performed of the dominant
right inferior thyroid nodule.

## 2014-11-30 ENCOUNTER — Encounter: Payer: Self-pay | Admitting: Internal Medicine

## 2014-12-01 ENCOUNTER — Ambulatory Visit (INDEPENDENT_AMBULATORY_CARE_PROVIDER_SITE_OTHER): Payer: BC Managed Care – PPO | Admitting: Internal Medicine

## 2014-12-01 ENCOUNTER — Encounter: Payer: Self-pay | Admitting: Internal Medicine

## 2014-12-01 VITALS — BP 104/70 | HR 72 | Temp 98.2°F | Resp 18 | Ht 65.5 in | Wt 165.0 lb

## 2014-12-01 DIAGNOSIS — Z0000 Encounter for general adult medical examination without abnormal findings: Secondary | ICD-10-CM | POA: Diagnosis not present

## 2014-12-01 DIAGNOSIS — I10 Essential (primary) hypertension: Secondary | ICD-10-CM | POA: Diagnosis not present

## 2014-12-01 DIAGNOSIS — Z1389 Encounter for screening for other disorder: Secondary | ICD-10-CM

## 2014-12-01 DIAGNOSIS — Z79899 Other long term (current) drug therapy: Secondary | ICD-10-CM

## 2014-12-01 DIAGNOSIS — E041 Nontoxic single thyroid nodule: Secondary | ICD-10-CM

## 2014-12-01 DIAGNOSIS — E782 Mixed hyperlipidemia: Secondary | ICD-10-CM

## 2014-12-01 DIAGNOSIS — Z130 Encounter for screening for diseases of the blood and blood-forming organs and certain disorders involving the immune mechanism: Secondary | ICD-10-CM

## 2014-12-01 DIAGNOSIS — E559 Vitamin D deficiency, unspecified: Secondary | ICD-10-CM | POA: Diagnosis not present

## 2014-12-01 DIAGNOSIS — Z136 Encounter for screening for cardiovascular disorders: Secondary | ICD-10-CM

## 2014-12-01 DIAGNOSIS — Z131 Encounter for screening for diabetes mellitus: Secondary | ICD-10-CM

## 2014-12-01 LAB — CBC WITH DIFFERENTIAL/PLATELET
Basophils Absolute: 0 10*3/uL (ref 0.0–0.1)
Basophils Relative: 0 % (ref 0–1)
Eosinophils Absolute: 0.3 10*3/uL (ref 0.0–0.7)
Eosinophils Relative: 4 % (ref 0–5)
HCT: 40.9 % (ref 36.0–46.0)
Hemoglobin: 13.7 g/dL (ref 12.0–15.0)
Lymphocytes Relative: 34 % (ref 12–46)
Lymphs Abs: 2.6 10*3/uL (ref 0.7–4.0)
MCH: 30 pg (ref 26.0–34.0)
MCHC: 33.5 g/dL (ref 30.0–36.0)
MCV: 89.5 fL (ref 78.0–100.0)
MPV: 10.4 fL (ref 8.6–12.4)
Monocytes Absolute: 0.6 10*3/uL (ref 0.1–1.0)
Monocytes Relative: 8 % (ref 3–12)
Neutro Abs: 4.2 10*3/uL (ref 1.7–7.7)
Neutrophils Relative %: 54 % (ref 43–77)
Platelets: 305 10*3/uL (ref 150–400)
RBC: 4.57 MIL/uL (ref 3.87–5.11)
RDW: 13.5 % (ref 11.5–15.5)
WBC: 7.7 10*3/uL (ref 4.0–10.5)

## 2014-12-01 NOTE — Progress Notes (Signed)
Patient ID: Shawna Forbes, female   DOB: 12/23/1970, 44 y.o.   MRN: [id number]    Annual Screening Comprehensive Examination   This very nice 44 y.o.female presents for complete physical.  Patient has no major health issues.  Patient reports no complaints at this time.   Finally, patient has history of Vitamin D Deficiency and last vitamin D was  Lab Results  Component Value Date   VD25OH 64 11/20/2013  .  Currently on supplementation  She reports that she is still taking her red yeast rice for her cholesterol.    She reports no changes in her family history.  She is still following with ObGyn yearly. She has had abnormal paps in the past and has them yearly.  She has mammograms with ObGyn.  She is up to date.  All vaccines are up to date.   Current Outpatient Prescriptions on File Prior to Visit  Medication Sig Dispense Refill  . Ascorbic Acid (VITAMIN C) 1000 MG tablet Take 1,000 mg by mouth daily.    Marland Kitchen. aspirin 81 MG tablet Take 81 mg by mouth daily.    . calcium carbonate (OS-CAL) 600 MG TABS tablet Take 600 mg by mouth 2 (two) times daily with a meal.    . eletriptan (RELPAX) 40 MG tablet Take 1 tablet (40 mg total) by mouth as needed for migraine or headache. One tablet by mouth at onset of headache. May repeat in 2 hours if headache persists or recurs. 10 tablet 1  . Magnesium 250 MG TABS Take by mouth.    . Multiple Vitamins-Minerals (MULTIVITAMIN WITH MINERALS) tablet Take 1 tablet by mouth. Three times per week    . Red Yeast Rice 600 MG CAPS Take 600 mg by mouth 2 (two) times daily.    . Vitamin D, Cholecalciferol, 1000 UNITS TABS Take 1,000 Units by mouth. Three times per week     No current facility-administered medications on file prior to visit.    Allergies  Allergen Reactions  . Tetracyclines & Related     Hives    Past Medical History  Diagnosis Date  . Hyperlipidemia   . Allergy   . Migraine   . Unspecified vitamin D deficiency   . GERD  (gastroesophageal reflux disease) 11/25/2013  . Depression   . Helicobacter pylori ab+   . Esophagitis     Immunization History  Administered Date(s) Administered  . PPD Test 11/20/2013  . Td 01/17/2004  . Tdap 07/17/2013    Past Surgical History  Procedure Laterality Date  . Gynecologic cryosurgery    . Mole removal      multiple benign- Dr. Emily FilbertGould    Family History  Problem Relation Age of Onset  . Hypertension Mother   . Hyperlipidemia Mother   . Hypothyroidism Mother   . Hypothyroidism Father   . Cancer Maternal Grandfather     spine  . Diabetes Other     juvenille  . Colon cancer Neg Hx   . Colon polyps Mother   . Irritable bowel syndrome Mother   . Heart disease Maternal Grandfather   . Kidney disease Neg Hx   . Gallbladder disease Neg Hx   . Esophageal cancer Neg Hx     Social History   Social History  . Marital Status: Married    Spouse Name: N/A  . Number of Children: 2  . Years of Education: N/A   Occupational History  . Emergency planning/management officerroject Manager    Social History Main Topics  .  Smoking status: Never Smoker   . Smokeless tobacco: Never Used  . Alcohol Use: 0.0 oz/week    0 Standard drinks or equivalent per week     Comment: Socially  . Drug Use: No  . Sexual Activity: Yes    Birth Control/ Protection: None   Other Topics Concern  . Not on file   Social History Narrative   Review of Systems  Constitutional: Negative for fever, chills and malaise/fatigue.  HENT: Negative for congestion, ear discharge and sore throat.   Eyes: Negative.   Respiratory: Negative for cough, shortness of breath and wheezing.   Cardiovascular: Negative for chest pain, palpitations and leg swelling.  Gastrointestinal: Negative for heartburn, diarrhea, constipation, blood in stool and melena.  Genitourinary: Negative.   Skin: Negative.   Neurological: Negative for dizziness, sensory change, loss of consciousness and headaches.  Psychiatric/Behavioral: Negative for  depression. The patient is not nervous/anxious and does not have insomnia.       Physical Exam  BP 104/70 mmHg  Pulse 72  Temp(Src) 98.2 F (36.8 C) (Temporal)  Resp 18  Ht 5' 5.5" (1.664 m)  Wt 165 lb (74.844 kg)  BMI 27.03 kg/m2  General Appearance: Well nourished and in no apparent distress. Eyes: PERRLA, EOMs, conjunctiva no swelling or erythema, normal fundi and vessels. Sinuses: No frontal/maxillary tenderness ENT/Mouth: EACs patent / TMs  nl. Nares clear without erythema, swelling, mucoid exudates. Oral hygiene is good. No erythema, swelling, or exudate. Tongue normal, non-obstructing. Tonsils not swollen or erythematous. Hearing normal.  Neck: Supple, thyroid normal. No bruits, nodes or JVD. Respiratory: Respiratory effort normal.  BS equal and clear bilateral without rales, rhonci, wheezing or stridor. Cardio: Heart sounds are normal with regular rate and rhythm and no murmurs, rubs or gallops. Peripheral pulses are normal and equal bilaterally without edema. No aortic or femoral bruits. Chest: symmetric with normal excursions and percussion. Breasts: Symmetric, without lumps, nipple discharge, retractions, or fibrocystic changes.  Abdomen: Flat, soft, with bowl sounds. Nontender, no guarding, rebound, hernias, masses, or organomegaly.  Lymphatics: Non tender without lymphadenopathy.  Genitourinary:  Musculoskeletal: Full ROM all peripheral extremities, joint stability, 5/5 strength, and normal gait. Skin: Warm and dry without rashes, lesions, cyanosis, clubbing or  ecchymosis.  Neuro: Cranial nerves intact, reflexes equal bilaterally. Normal muscle tone, no cerebellar symptoms. Sensation intact.  Pysch: Awake and oriented X 3, normal affect, Insight and Judgment appropriate.   Assessment and Plan   1. Right thyroid nodule -exam normal - TSH  2. Mixed hyperlipidemia -cont red yeast rice - Lipid panel  3. Vitamin D deficiency  - VITAMIN D 25 Hydroxy (Vit-D  Deficiency, Fractures)  4. Routine general medical examination at a health care facility  - CBC with Differential/Platelet - BASIC METABOLIC PANEL WITH GFR - Hepatic function panel - Lipid panel - Hemoglobin A1c - Insulin, random - Iron and TIBC - Vitamin B12 - Urinalysis, Routine w reflex microscopic (not at [facility]) - Microalbumin / creatinine urine ratio - EKG 12-Lead - VITAMIN D 25 Hydroxy (Vit-D Deficiency, Fractures) - TSH  5. Screening for diabetes mellitus  - Hemoglobin A1c - Insulin, random  6. Screening for deficiency anemia  - Iron and TIBC - Vitamin B12  7. Screening for hematuria or proteinuria  - Urinalysis, Routine w reflex microscopic (not at [facility]) - Microalbumin / creatinine urine ratio  8. Screening for cardiovascular condition  - EKG 12-Lead  9. Medication management  - CBC with Differential/Platelet - BASIC METABOLIC PANEL WITH GFR -  Hepatic function panel  Up to date on all screening examinations.  Cont diet and exercise.  Cont follow-up with Dr. Rana Snare     Continue prudent diet as discussed, weight control, regular exercise, and medications. Routine screening labs and tests as requested with regular follow-up as recommended.  Over 40 minutes of exam, counseling, chart review and critical decision making was performed

## 2014-12-01 NOTE — Patient Instructions (Signed)

## 2014-12-02 LAB — BASIC METABOLIC PANEL WITH GFR
BUN: 12 mg/dL (ref 7–25)
CO2: 25 mmol/L (ref 20–31)
Calcium: 9.7 mg/dL (ref 8.6–10.2)
Chloride: 102 mmol/L (ref 98–110)
Creat: 0.7 mg/dL (ref 0.50–1.10)
GFR, Est African American: >89 mL/min (ref >=60)
GFR, Est Non African American: >89 mL/min (ref >=60)
Glucose, Bld: 72 mg/dL (ref 65–99)
Potassium: 3.7 mmol/L (ref 3.5–5.3)
Sodium: 137 mmol/L (ref 135–146)

## 2014-12-02 LAB — HEPATIC FUNCTION PANEL
ALT: 19 U/L (ref 6–29)
AST: 21 U/L (ref 10–30)
Albumin: 4.7 g/dL (ref 3.6–5.1)
Alkaline Phosphatase: 40 U/L (ref 33–115)
Bilirubin, Direct: 0.1 mg/dL (ref <=0.2)
Indirect Bilirubin: 0.6 mg/dL (ref 0.2–1.2)
Total Bilirubin: 0.7 mg/dL (ref 0.2–1.2)
Total Protein: 7.8 g/dL (ref 6.1–8.1)

## 2014-12-02 LAB — HEMOGLOBIN A1C
Hgb A1c MFr Bld: 5.1 % (ref <5.7)
Mean Plasma Glucose: 100 mg/dL (ref <117)

## 2014-12-02 LAB — URINALYSIS, ROUTINE W REFLEX MICROSCOPIC
Bilirubin Urine: NEGATIVE
Glucose, UA: NEGATIVE
Hgb urine dipstick: NEGATIVE
Ketones, ur: NEGATIVE
Leukocytes, UA: NEGATIVE
Nitrite: NEGATIVE
Protein, ur: NEGATIVE
Specific Gravity, Urine: 1.008 (ref 1.001–1.035)
pH: 6.5 (ref 5.0–8.0)

## 2014-12-02 LAB — TSH: TSH: 0.685 u[IU]/mL (ref 0.350–4.500)

## 2014-12-02 LAB — LIPID PANEL
Cholesterol: 228 mg/dL — ABNORMAL HIGH (ref 125–200)
HDL: 80 mg/dL (ref >=46)
LDL Cholesterol: 131 mg/dL — ABNORMAL HIGH (ref <130)
Total CHOL/HDL Ratio: 2.9 Ratio (ref <=5.0)
Triglycerides: 87 mg/dL (ref <150)
VLDL: 17 mg/dL (ref <30)

## 2014-12-02 LAB — MICROALBUMIN / CREATININE URINE RATIO
Creatinine, Urine: 21 mg/dL (ref 20–320)
Microalb, Ur: <0.2 mg/dL

## 2014-12-02 LAB — IRON AND TIBC
%SAT: 33 % (ref 11–50)
Iron: 99 ug/dL (ref 40–190)
TIBC: 302 ug/dL (ref 250–450)
UIBC: 203 ug/dL (ref 125–400)

## 2014-12-02 LAB — VITAMIN B12: Vitamin B-12: 794 pg/mL (ref 211–911)

## 2014-12-02 LAB — INSULIN, RANDOM: Insulin: 4.9 u[IU]/mL (ref 2.0–19.6)

## 2014-12-02 LAB — VITAMIN D 25 HYDROXY (VIT D DEFICIENCY, FRACTURES): Vit D, 25-Hydroxy: 41 ng/mL (ref 30–100)

## 2015-02-02 ENCOUNTER — Ambulatory Visit (INDEPENDENT_AMBULATORY_CARE_PROVIDER_SITE_OTHER): Payer: BC Managed Care – PPO | Admitting: Internal Medicine

## 2015-02-02 ENCOUNTER — Encounter: Payer: Self-pay | Admitting: Internal Medicine

## 2015-02-02 VITALS — BP 110/70 | HR 88 | Temp 98.4°F | Resp 18 | Ht 65.5 in | Wt 170.0 lb

## 2015-02-02 DIAGNOSIS — R609 Edema, unspecified: Secondary | ICD-10-CM | POA: Diagnosis not present

## 2015-02-02 DIAGNOSIS — R1013 Epigastric pain: Secondary | ICD-10-CM | POA: Diagnosis not present

## 2015-02-02 DIAGNOSIS — R140 Abdominal distension (gaseous): Secondary | ICD-10-CM | POA: Diagnosis not present

## 2015-02-02 LAB — BASIC METABOLIC PANEL WITH GFR
BUN: 9 mg/dL (ref 7–25)
CO2: 26 mmol/L (ref 20–31)
Calcium: 9.5 mg/dL (ref 8.6–10.2)
Chloride: 105 mmol/L (ref 98–110)
Creat: 0.67 mg/dL (ref 0.50–1.10)
GFR, Est African American: >89 mL/min (ref >=60)
GFR, Est Non African American: >89 mL/min (ref >=60)
Glucose, Bld: 61 mg/dL — ABNORMAL LOW (ref 65–99)
Potassium: 4.3 mmol/L (ref 3.5–5.3)
Sodium: 140 mmol/L (ref 135–146)

## 2015-02-02 LAB — CBC WITH DIFFERENTIAL/PLATELET
Basophils Absolute: 0 10*3/uL (ref 0.0–0.1)
Basophils Relative: 0 % (ref 0–1)
Eosinophils Absolute: 0.1 10*3/uL (ref 0.0–0.7)
Eosinophils Relative: 2 % (ref 0–5)
HCT: 41.4 % (ref 36.0–46.0)
Hemoglobin: 13.7 g/dL (ref 12.0–15.0)
Lymphocytes Relative: 38 % (ref 12–46)
Lymphs Abs: 2.6 10*3/uL (ref 0.7–4.0)
MCH: 29.8 pg (ref 26.0–34.0)
MCHC: 33.1 g/dL (ref 30.0–36.0)
MCV: 90 fL (ref 78.0–100.0)
MPV: 9.9 fL (ref 8.6–12.4)
Monocytes Absolute: 0.7 10*3/uL (ref 0.1–1.0)
Monocytes Relative: 10 % (ref 3–12)
Neutro Abs: 3.4 10*3/uL (ref 1.7–7.7)
Neutrophils Relative %: 50 % (ref 43–77)
Platelets: 282 10*3/uL (ref 150–400)
RBC: 4.6 MIL/uL (ref 3.87–5.11)
RDW: 12.9 % (ref 11.5–15.5)
WBC: 6.8 10*3/uL (ref 4.0–10.5)

## 2015-02-02 LAB — HEPATIC FUNCTION PANEL
ALT: 14 U/L (ref 6–29)
AST: 18 U/L (ref 10–30)
Albumin: 4.3 g/dL (ref 3.6–5.1)
Alkaline Phosphatase: 32 U/L — ABNORMAL LOW (ref 33–115)
Bilirubin, Direct: 0.1 mg/dL (ref <=0.2)
Indirect Bilirubin: 0.4 mg/dL (ref 0.2–1.2)
Total Bilirubin: 0.5 mg/dL (ref 0.2–1.2)
Total Protein: 7.1 g/dL (ref 6.1–8.1)

## 2015-02-02 LAB — LIPASE: Lipase: 9 U/L (ref 7–60)

## 2015-02-02 MED ORDER — METOCLOPRAMIDE HCL 10 MG PO TABS: 10.0000 mg | ORAL_TABLET | Freq: Three times a day (TID) | ORAL | Status: DC

## 2015-02-02 NOTE — Patient Instructions (Signed)

## 2015-02-02 NOTE — Progress Notes (Signed)
Subjective:    Patient ID: Shawna Forbes, female    DOB: 1970/12/22, 45 y.o.   MRN: [id number]  Gastroesophageal Reflux She complains of abdominal pain and coughing. She reports no chest pain, no nausea or no wheezing. Pertinent negatives include no fatigue.  Patient presents to the office for evaluation of GERD and bloating.  She reports that she has been having some issues with swelling in her hands and her feet and she also reports that her stomach seems to be getting distended.  She reports that she feels like this is more painful.  She has a history of GERD and was on PPIs and came off them approximately a year ago that she reports that she tried to get in to see Dr. Rhea Belton but she can't see him until March.  She does have some mild constipation x 1 day.  She has added a lot more greens to the diet.  She does not eat artificial sweeteners.  She does not add salt into her food.  She reports that the stools went back to normal.  She is burping a lot.  She also reports that she has a lot of gas.  She has not tried any medications.    She does still have a gallbladder.  She has had a cold recently, but no other illnesses.    Most recent endoscopy 01/21/14 was normal with only minor reflux.  Mother has IBS.  Both aunts have issues with IBS as well.    Review of Systems  Constitutional: Negative for chills and fatigue.  Respiratory: Positive for cough. Negative for chest tightness, shortness of breath and wheezing.   Cardiovascular: Positive for leg swelling. Negative for chest pain and palpitations.  Gastrointestinal: Positive for abdominal pain, constipation and abdominal distention. Negative for nausea, vomiting, diarrhea, blood in stool and rectal pain.  Genitourinary: Negative for dysuria, urgency, frequency, hematuria and difficulty urinating.  Neurological: Negative for dizziness, weakness and numbness.       Objective:   Physical Exam  Constitutional: She is oriented to person,  place, and time. She appears well-developed and well-nourished. No distress.  HENT:  Head: Normocephalic.  Mouth/Throat: Oropharynx is clear and moist. No oropharyngeal exudate.  Eyes: Conjunctivae are normal. No scleral icterus.  Neck: Normal range of motion. Neck supple. No JVD present. No thyromegaly present.  Cardiovascular: Normal rate, regular rhythm, normal heart sounds and intact distal pulses.  Exam reveals no gallop and no friction rub.   No murmur heard. Pulmonary/Chest: Effort normal and breath sounds normal. No respiratory distress. She has no wheezes. She has no rales. She exhibits no tenderness.  Abdominal: Soft. Normal appearance and bowel sounds are normal. She exhibits no distension and no mass. There is tenderness in the epigastric area. There is no rigidity, no rebound, no guarding, no CVA tenderness, no tenderness at McBurney's point and negative Murphy's sign.  Musculoskeletal: Normal range of motion.  Lymphadenopathy:    She has no cervical adenopathy.  Neurological: She is alert and oriented to person, place, and time.  Skin: Skin is warm and dry. She is not diaphoretic.  Psychiatric: She has a normal mood and affect. Her behavior is normal. Judgment and thought content normal.  Nursing note and vitals reviewed.   Filed Vitals:   02/02/15 1112  BP: 110/70  Pulse: 88  Temp: 98.4 F (36.9 C)  Resp: 18         Assessment & Plan:    1. Abdominal bloating  -  metoCLOPramide (REGLAN) 10 MG tablet; Take 1 tablet (10 mg total) by mouth 3 (three) times daily with meals.  Dispense: 90 tablet; Refill: 1  2. Abdominal pain, epigastric  - CBC with Differential/Platelet - BASIC METABOLIC PANEL WITH GFR - Hepatic function panel - Lipase  3. Peripheral edema -no notable peripheral swelling on exam -patient reports low sodium diet - Urinalysis, Routine w reflex microscopic (not at [facility])

## 2015-02-03 LAB — URINALYSIS, ROUTINE W REFLEX MICROSCOPIC
Bilirubin Urine: NEGATIVE
Glucose, UA: NEGATIVE
Hgb urine dipstick: NEGATIVE
Ketones, ur: NEGATIVE
Leukocytes, UA: NEGATIVE
Nitrite: NEGATIVE
Protein, ur: NEGATIVE
Specific Gravity, Urine: 1.004 (ref 1.001–1.035)
pH: 7 (ref 5.0–8.0)

## 2015-02-17 ENCOUNTER — Encounter: Payer: Self-pay | Admitting: Internal Medicine

## 2015-02-17 ENCOUNTER — Ambulatory Visit (INDEPENDENT_AMBULATORY_CARE_PROVIDER_SITE_OTHER): Payer: BC Managed Care – PPO | Admitting: Internal Medicine

## 2015-02-17 VITALS — BP 112/64 | HR 71 | Temp 98.3°F | Resp 16 | Ht 65.5 in | Wt 167.8 lb

## 2015-02-17 DIAGNOSIS — K219 Gastro-esophageal reflux disease without esophagitis: Secondary | ICD-10-CM

## 2015-02-17 MED ORDER — RANITIDINE HCL 300 MG PO TABS: 300.0000 mg | ORAL_TABLET | Freq: Every day | ORAL | Status: DC

## 2015-02-17 MED ORDER — ESOMEPRAZOLE MAGNESIUM 40 MG PO CPDR: 40.0000 mg | DELAYED_RELEASE_CAPSULE | Freq: Every day | ORAL | Status: DC

## 2015-02-17 NOTE — Progress Notes (Signed)
   Subjective:    Patient ID: Shawna Forbes, female    DOB: November 15, 1970, 45 y.o.   MRN: [id number]  HPI  Patient presents to the office for evaluation of acid reflux and abdominal pain.  She reports that she is doing better.  She did take 2 weeks worth of nexium and that seems to help.  She reports that she occasionally gets the bloating.  She reports that she has been eating a lot cleaner and she is also making sure that she is working out and getting more exercise.  She reports that she is resistant to taking more medication.  She never ended up taking the reglan.  She also reports that she has less stress now because she is not having to deal much with her exhusband or the stress of the holidays now.    Review of Systems  Constitutional: Negative for fever, chills and fatigue.  Respiratory: Negative for chest tightness and shortness of breath.   Cardiovascular: Negative for chest pain and palpitations.  Gastrointestinal: Negative for nausea, vomiting, diarrhea, constipation, blood in stool and abdominal distention.  Genitourinary: Negative for dysuria, urgency, frequency and difficulty urinating.       Objective:   Physical Exam  Constitutional: She is oriented to person, place, and time. She appears well-developed and well-nourished. No distress.  HENT:  Head: Normocephalic.  Mouth/Throat: Oropharynx is clear and moist. No oropharyngeal exudate.  Eyes: Conjunctivae are normal. No scleral icterus.  Neck: Normal range of motion. Neck supple. No JVD present. No thyromegaly present.  Cardiovascular: Normal rate, regular rhythm, normal heart sounds and intact distal pulses.  Exam reveals no gallop and no friction rub.   No murmur heard. Pulmonary/Chest: Effort normal and breath sounds normal. No respiratory distress. She has no wheezes. She has no rales. She exhibits no tenderness.  Abdominal: Soft. Bowel sounds are normal. She exhibits no distension and no mass. There is no tenderness.  There is no rebound and no guarding.  Musculoskeletal: Normal range of motion.  Lymphadenopathy:    She has no cervical adenopathy.  Neurological: She is alert and oriented to person, place, and time.  Skin: Skin is warm and dry. She is not diaphoretic.  Psychiatric: She has a normal mood and affect. Her behavior is normal. Judgment and thought content normal.  Nursing note and vitals reviewed.   Filed Vitals:   02/17/15 1118  BP: 112/64  Pulse: 71  Temp: 98.3 F (36.8 C)  Resp: 16          Assessment & Plan:    1. Gastroesophageal reflux disease, esophagitis presence not specified -Nexium x 1 month and then begin taper with zantac -foods and preventative care discussed -appears to be improved

## 2015-03-19 ENCOUNTER — Ambulatory Visit: Payer: BC Managed Care – PPO | Admitting: Internal Medicine

## 2015-04-23 ENCOUNTER — Encounter: Payer: Self-pay | Admitting: Internal Medicine

## 2015-04-23 ENCOUNTER — Ambulatory Visit (INDEPENDENT_AMBULATORY_CARE_PROVIDER_SITE_OTHER): Payer: BC Managed Care – PPO | Admitting: Internal Medicine

## 2015-04-23 VITALS — BP 122/74 | HR 88 | Temp 98.2°F | Resp 18 | Ht 65.5 in | Wt 168.0 lb

## 2015-04-23 DIAGNOSIS — R300 Dysuria: Secondary | ICD-10-CM | POA: Diagnosis not present

## 2015-04-23 MED ORDER — SULFAMETHOXAZOLE-TRIMETHOPRIM 800-160 MG PO TABS: 1.0000 | ORAL_TABLET | Freq: Two times a day (BID) | ORAL | Status: DC

## 2015-04-23 NOTE — Progress Notes (Signed)
   Subjective:    Patient ID: Shawna Forbes, female    DOB: 25-Aug-1970, 45 y.o.   MRN: [id number]  Dysuria  This is a new problem. The current episode started yesterday. The problem occurs intermittently. The problem has been unchanged. Quality: urinary pressure. The pain is mild. There has been no fever. Associated symptoms include frequency and urgency. Pertinent negatives include no chills, flank pain, hematuria, nausea, possible pregnancy, sweats or vomiting. Treatments tried: water and cranberry juice. The treatment provided no relief. Her past medical history is significant for recurrent UTIs.      Review of Systems  Constitutional: Negative for chills.  Gastrointestinal: Negative for nausea and vomiting.  Genitourinary: Positive for dysuria, urgency and frequency. Negative for hematuria and flank pain.       Objective:   Physical Exam  Constitutional: She is oriented to person, place, and time. She appears well-developed and well-nourished. No distress.  HENT:  Head: Normocephalic.  Mouth/Throat: Oropharynx is clear and moist. No oropharyngeal exudate.  Eyes: Conjunctivae are normal. No scleral icterus.  Neck: Normal range of motion. Neck supple. No JVD present. No thyromegaly present.  Cardiovascular: Normal rate, regular rhythm, normal heart sounds and intact distal pulses.  Exam reveals no gallop and no friction rub.   No murmur heard. Pulmonary/Chest: Effort normal and breath sounds normal. No respiratory distress. She has no wheezes. She has no rales. She exhibits no tenderness.  Abdominal: Soft. Normal appearance and bowel sounds are normal. She exhibits no distension and no mass. There is no tenderness. There is no rebound, no guarding and no CVA tenderness.  Musculoskeletal: Normal range of motion.  Lymphadenopathy:    She has no cervical adenopathy.  Neurological: She is alert and oriented to person, place, and time.  Skin: Skin is warm and dry. She is not  diaphoretic.  Psychiatric: She has a normal mood and affect. Her behavior is normal. Judgment and thought content normal.  Nursing note and vitals reviewed.   Filed Vitals:   04/23/15 1032  BP: 122/74  Pulse: 88  Temp: 98.2 F (36.8 C)  Resp: 18         Assessment & Plan:    1. Dysuria -bactrim DS BID x 3 days -diflucan prn for yeast infections - Urinalysis, Rout[facility] w reflex microscopic [facility] - Culture, Urine

## 2015-04-24 LAB — URINALYSIS, ROUTINE W REFLEX MICROSCOPIC
Bilirubin Urine: NEGATIVE
Glucose, UA: NEGATIVE
Ketones, ur: NEGATIVE
Nitrite: NEGATIVE
Protein, ur: NEGATIVE
Specific Gravity, Urine: 1.01 (ref 1.001–1.035)
pH: 7 (ref 5.0–8.0)

## 2015-04-24 LAB — URINALYSIS, MICROSCOPIC ONLY
Bacteria, UA: NONE SEEN [HPF]
Casts: NONE SEEN [LPF]
Crystals: NONE SEEN [HPF]
RBC / HPF: NONE SEEN RBC/HPF (ref <=2)
Squamous Epithelial / LPF: NONE SEEN [HPF] (ref <=5)
WBC, UA: NONE SEEN WBC/HPF (ref <=5)
Yeast: NONE SEEN [HPF]

## 2015-04-25 LAB — URINE CULTURE: Colony Count: 25000

## 2015-05-17 ENCOUNTER — Other Ambulatory Visit: Payer: Self-pay | Admitting: Internal Medicine

## 2015-05-17 MED ORDER — CIPROFLOXACIN HCL 500 MG PO TABS: 500.0000 mg | ORAL_TABLET | Freq: Two times a day (BID) | ORAL | Status: AC

## 2015-05-20 ENCOUNTER — Other Ambulatory Visit: Payer: Self-pay | Admitting: Emergency Medicine

## 2015-05-24 ENCOUNTER — Other Ambulatory Visit: Payer: Self-pay | Admitting: Emergency Medicine

## 2015-06-17 ENCOUNTER — Other Ambulatory Visit: Payer: Self-pay

## 2015-06-18 ENCOUNTER — Other Ambulatory Visit: Payer: 59

## 2015-06-18 DIAGNOSIS — R300 Dysuria: Secondary | ICD-10-CM

## 2015-06-18 LAB — URINALYSIS, MICROSCOPIC ONLY
Bacteria, UA: NONE SEEN [HPF]
Casts: NONE SEEN [LPF]
Crystals: NONE SEEN [HPF]
Yeast: NONE SEEN [HPF]

## 2015-06-18 LAB — URINALYSIS, ROUTINE W REFLEX MICROSCOPIC
Bilirubin Urine: NEGATIVE
Glucose, UA: NEGATIVE
Hgb urine dipstick: NEGATIVE
Ketones, ur: NEGATIVE
Nitrite: NEGATIVE
Protein, ur: NEGATIVE
Specific Gravity, Urine: 1.016 (ref 1.001–1.035)
pH: 6 (ref 5.0–8.0)

## 2015-06-19 LAB — URINE CULTURE
Colony Count: NO GROWTH
Organism ID, Bacteria: NO GROWTH

## 2015-08-30 ENCOUNTER — Other Ambulatory Visit: Payer: Self-pay | Admitting: Internal Medicine

## 2015-09-06 ENCOUNTER — Ambulatory Visit (INDEPENDENT_AMBULATORY_CARE_PROVIDER_SITE_OTHER): Payer: 59 | Admitting: Internal Medicine

## 2015-09-06 ENCOUNTER — Encounter: Payer: Self-pay | Admitting: Internal Medicine

## 2015-09-06 VITALS — BP 112/66 | HR 72 | Temp 98.2°F | Resp 16 | Ht 65.5 in | Wt 180.0 lb

## 2015-09-06 DIAGNOSIS — E041 Nontoxic single thyroid nodule: Secondary | ICD-10-CM | POA: Diagnosis not present

## 2015-09-06 NOTE — Progress Notes (Signed)
   Subjective:    Patient ID: Shawna Forbes, female    DOB: Oct 03, 1970, 45 y.o.   MRN: [id number]  HPI  Patient presents to the office for evaluation of thyroid issues.  She reports that she has seen an endocrinologist.  She was found to have a right thyroid nodule.  She reports that the thyroid swells intermittently.  She reports that she would like to see Dr. [facility] because this is who her mother saw and they were very happy with him.  She had her last ultrasound of her thyroid in July of 2014.  She does feel like she is very fatigued all the time.  She is also noticing a lot more swelling in her hands and feet, intermittent swelling of the neck and constipation.  She also always feels cold.    Review of Systems  Constitutional: Positive for fatigue. Negative for chills and fever.  HENT: Positive for congestion, sore throat and trouble swallowing. Negative for ear pain, postnasal drip, rhinorrhea, sinus pressure and sneezing.   Respiratory: Negative for chest tightness and shortness of breath.   Gastrointestinal: Positive for constipation. Negative for abdominal distention, abdominal pain, anal bleeding, blood in stool, diarrhea, nausea and vomiting.       Objective:   Physical Exam  Constitutional: She is oriented to person, place, and time. She appears well-developed and well-nourished. No distress.  HENT:  Head: Normocephalic.  Mouth/Throat: Oropharynx is clear and moist. No oropharyngeal exudate.  Eyes: Conjunctivae are normal. No scleral icterus.  Neck: Normal range of motion. Neck supple. No JVD present. No thyromegaly present.  Thyroid does feel normal to palpation.    Cardiovascular: Normal rate, regular rhythm, normal heart sounds and intact distal pulses.  Exam reveals no gallop and no friction rub.   No murmur heard. Pulmonary/Chest: Effort normal and breath sounds normal. No respiratory distress. She has no wheezes. She has no rales. She exhibits no tenderness.  Abdominal:  Soft. Bowel sounds are normal. She exhibits no distension and no mass. There is no tenderness. There is no rebound and no guarding.  Musculoskeletal: Normal range of motion.  Lymphadenopathy:    She has no cervical adenopathy.  Neurological: She is alert and oriented to person, place, and time.  Skin: Skin is warm and dry. She is not diaphoretic.  Psychiatric: Her behavior is normal. Judgment and thought content normal.  Nursing note and vitals reviewed.   Vitals:   09/06/15 0909  BP: 112/66  Pulse: 72  Resp: 16  Temp: 98.2 F (36.8 C)           Assessment & Plan:    1. Thyroid nodule -referral to endocrinology - Thyroid Panel With TSH - US Soft Tissue Head/Neck; Future

## 2015-09-07 LAB — THYROID PANEL WITH TSH
Free Thyroxine Index: 1.6 (ref 1.4–3.8)
T3 Uptake: 27 % (ref 22–35)
T4, Total: 5.8 ug/dL (ref 4.5–12.0)
TSH: 0.41 mIU/L

## 2015-09-08 ENCOUNTER — Ambulatory Visit
Admission: RE | Admit: 2015-09-08 | Discharge: 2015-09-08 | Disposition: A | Discharge status: Home / Self Care | Payer: 59 | Source: Ambulatory Visit | Attending: Internal Medicine | Admitting: Internal Medicine

## 2015-09-08 DIAGNOSIS — E041 Nontoxic single thyroid nodule: Secondary | ICD-10-CM

## 2015-11-25 ENCOUNTER — Other Ambulatory Visit: Payer: Self-pay | Admitting: Internal Medicine

## 2015-12-01 ENCOUNTER — Ambulatory Visit (INDEPENDENT_AMBULATORY_CARE_PROVIDER_SITE_OTHER): Payer: 59 | Admitting: Internal Medicine

## 2015-12-01 ENCOUNTER — Encounter: Payer: Self-pay | Admitting: Internal Medicine

## 2015-12-01 VITALS — BP 108/60 | HR 86 | Temp 98.0°F | Resp 16 | Ht 65.5 in | Wt 183.0 lb

## 2015-12-01 DIAGNOSIS — Z136 Encounter for screening for cardiovascular disorders: Secondary | ICD-10-CM

## 2015-12-01 DIAGNOSIS — Z0000 Encounter for general adult medical examination without abnormal findings: Secondary | ICD-10-CM

## 2015-12-01 DIAGNOSIS — E559 Vitamin D deficiency, unspecified: Secondary | ICD-10-CM

## 2015-12-01 DIAGNOSIS — Z1389 Encounter for screening for other disorder: Secondary | ICD-10-CM

## 2015-12-01 DIAGNOSIS — Z131 Encounter for screening for diabetes mellitus: Secondary | ICD-10-CM

## 2015-12-01 DIAGNOSIS — F331 Major depressive disorder, recurrent, moderate: Secondary | ICD-10-CM

## 2015-12-01 DIAGNOSIS — Z13220 Encounter for screening for lipoid disorders: Secondary | ICD-10-CM

## 2015-12-01 DIAGNOSIS — I10 Essential (primary) hypertension: Secondary | ICD-10-CM

## 2015-12-01 DIAGNOSIS — Z1329 Encounter for screening for other suspected endocrine disorder: Secondary | ICD-10-CM

## 2015-12-01 DIAGNOSIS — Z130 Encounter for screening for diseases of the blood and blood-forming organs and certain disorders involving the immune mechanism: Secondary | ICD-10-CM

## 2015-12-01 LAB — CBC WITH DIFFERENTIAL/PLATELET
Basophils Absolute: 79 cells/uL (ref 0–200)
Basophils Relative: 1 %
Eosinophils Absolute: 158 cells/uL (ref 15–500)
Eosinophils Relative: 2 %
HCT: 43.1 % (ref 35.0–45.0)
Hemoglobin: 14.1 g/dL (ref 11.7–15.5)
Lymphocytes Relative: 31 %
Lymphs Abs: 2449 cells/uL (ref 850–3900)
MCH: 29.4 pg (ref 27.0–33.0)
MCHC: 32.7 g/dL (ref 32.0–36.0)
MCV: 90 fL (ref 80.0–100.0)
MPV: 10.4 fL (ref 7.5–12.5)
Monocytes Absolute: 632 cells/uL (ref 200–950)
Monocytes Relative: 8 %
Neutro Abs: 4582 cells/uL (ref 1500–7800)
Neutrophils Relative %: 58 %
Platelets: 300 10*3/uL (ref 140–400)
RBC: 4.79 MIL/uL (ref 3.80–5.10)
RDW: 13.1 % (ref 11.0–15.0)
WBC: 7.9 10*3/uL (ref 3.8–10.8)

## 2015-12-01 LAB — HEPATIC FUNCTION PANEL
ALT: 25 U/L (ref 6–29)
AST: 23 U/L (ref 10–30)
Albumin: 4.5 g/dL (ref 3.6–5.1)
Alkaline Phosphatase: 45 U/L (ref 33–115)
Bilirubin, Direct: 0.1 mg/dL (ref <=0.2)
Indirect Bilirubin: 0.3 mg/dL (ref 0.2–1.2)
Total Bilirubin: 0.4 mg/dL (ref 0.2–1.2)
Total Protein: 7.7 g/dL (ref 6.1–8.1)

## 2015-12-01 LAB — IRON AND TIBC
%SAT: 28 % (ref 11–50)
Iron: 82 ug/dL (ref 40–190)
TIBC: 295 ug/dL (ref 250–450)
UIBC: 213 ug/dL (ref 125–400)

## 2015-12-01 LAB — BASIC METABOLIC PANEL WITH GFR
BUN: 12 mg/dL (ref 7–25)
CO2: 23 mmol/L (ref 20–31)
Calcium: 9.9 mg/dL (ref 8.6–10.2)
Chloride: 104 mmol/L (ref 98–110)
Creat: 0.83 mg/dL (ref 0.50–1.10)
GFR, Est African American: >89 mL/min (ref >=60)
GFR, Est Non African American: 86 mL/min (ref >=60)
Glucose, Bld: 88 mg/dL (ref 65–99)
Potassium: 4.5 mmol/L (ref 3.5–5.3)
Sodium: 139 mmol/L (ref 135–146)

## 2015-12-01 LAB — LIPID PANEL
Cholesterol: 210 mg/dL — ABNORMAL HIGH (ref <200)
HDL: 73 mg/dL (ref >50)
LDL Cholesterol: 112 mg/dL — ABNORMAL HIGH (ref <100)
Total CHOL/HDL Ratio: 2.9 Ratio (ref <5.0)
Triglycerides: 123 mg/dL (ref <150)
VLDL: 25 mg/dL (ref <30)

## 2015-12-01 LAB — MAGNESIUM: Magnesium: 2.2 mg/dL (ref 1.5–2.5)

## 2015-12-01 LAB — TSH: TSH: 0.54 mIU/L

## 2015-12-01 LAB — VITAMIN B12: Vitamin B-12: 584 pg/mL (ref 200–1100)

## 2015-12-01 MED ORDER — TRAZODONE HCL 100 MG PO TABS: 100.0000 mg | ORAL_TABLET | Freq: Every day | ORAL | 0 refills | Status: DC

## 2015-12-01 MED ORDER — BUPROPION HCL ER (XL) 150 MG PO TB24: 150.0000 mg | ORAL_TABLET | ORAL | 0 refills | Status: DC

## 2015-12-01 NOTE — Patient Instructions (Signed)
Bupropion extended-release tablets (Depression/Mood Disorders) What is this medicine? BUPROPION (byoo PROE pee on) is used to treat depression. This medicine may be used for other purposes; ask your health care provider or pharmacist if you have questions. COMMON BRAND NAME(S): Aplenzin, Budeprion XL, Forfivo XL, Wellbutrin XL What should I tell my health care provider before I take this medicine? They need to know if you have any of these conditions: -an eating disorder, such as anorexia or bulimia -bipolar disorder or psychosis -diabetes or high blood sugar, treated with medication -glaucoma -head injury or brain tumor -heart disease, previous heart attack, or irregular heart beat -high blood pressure -kidney or liver disease -seizures (convulsions) -suicidal thoughts or a previous suicide attempt -Tourette's syndrome -weight loss -an unusual or allergic reaction to bupropion, other medicines, foods, dyes, or preservatives -breast-feeding -pregnant or trying to become pregnant How should I use this medicine? Take this medicine by mouth with a glass of water. Follow the directions on the prescription label. You can take it with or without food. If it upsets your stomach, take it with food. Do not crush, chew, or cut these tablets. This medicine is taken once daily at the same time each day. Do not take your medicine more often than directed. Do not stop taking this medicine suddenly except upon the advice of your doctor. Stopping this medicine too quickly may cause serious side effects or your condition may worsen. A special MedGuide will be given to you by the pharmacist with each prescription and refill. Be sure to read this information carefully each time. Talk to your pediatrician regarding the use of this medicine in children. Special care may be needed. Overdosage: If you think you have taken too much of this medicine contact a poison control center or emergency room at once. NOTE:  This medicine is only for you. Do not share this medicine with others. What if I miss a dose? If you miss a dose, skip the missed dose and take your next tablet at the regular time. Do not take double or extra doses. What may interact with this medicine? Do not take this medicine with any of the following medications: -linezolid -MAOIs like Azilect, Carbex, Eldepryl, Marplan, Nardil, and Parnate -methylene blue (injected into a vein) -other medicines that contain bupropion like Zyban This medicine may also interact with the following medications: -alcohol -certain medicines for anxiety or sleep -certain medicines for blood pressure like metoprolol, propranolol -certain medicines for depression or psychotic disturbances -certain medicines for HIV or AIDS like efavirenz, lopinavir, nelfinavir, ritonavir -certain medicines for irregular heart beat like propafenone, flecainide -certain medicines for Parkinson's disease like amantadine, levodopa -certain medicines for seizures like carbamazepine, phenytoin, phenobarbital -cimetidine -clopidogrel -cyclophosphamide -digoxin -furazolidone -isoniazid -nicotine -orphenadrine -procarbazine -steroid medicines like prednisone or cortisone -stimulant medicines for attention disorders, weight loss, or to stay awake -tamoxifen -theophylline -thiotepa -ticlopidine -tramadol -warfarin This list may not describe all possible interactions. Give your health care provider a list of all the medicines, herbs, non-prescription drugs, or dietary supplements you use. Also tell them if you smoke, drink alcohol, or use illegal drugs. Some items may interact with your medicine. What should I watch for while using this medicine? Tell your doctor if your symptoms do not get better or if they get worse. Visit your doctor or health care professional for regular checks on your progress. Because it may take several weeks to see the full effects of this medicine, it  is important to continue your treatment as   prescribed by your doctor. Patients and their families should watch out for new or worsening thoughts of suicide or depression. Also watch out for sudden changes in feelings such as feeling anxious, agitated, panicky, irritable, hostile, aggressive, impulsive, severely restless, overly excited and hyperactive, or not being able to sleep. If this happens, especially at the beginning of treatment or after a change in dose, call your health care professional. Avoid alcoholic drinks while taking this medicine. Drinking large amounts of alcoholic beverages, using sleeping or anxiety medicines, or quickly stopping the use of these agents while taking this medicine may increase your risk for a seizure. Do not drive or use heavy machinery until you know how this medicine affects you. This medicine can impair your ability to perform these tasks. Do not take this medicine close to bedtime. It may prevent you from sleeping. Your mouth may get dry. Chewing sugarless gum or sucking hard candy, and drinking plenty of water may help. Contact your doctor if the problem does not go away or is severe. The tablet shell for some brands of this medicine does not dissolve. This is normal. The tablet shell may appear whole in the stool. This is not a cause for concern. What side effects may I notice from receiving this medicine? Side effects that you should report to your doctor or health care professional as soon as possible: -allergic reactions like skin rash, itching or hives, swelling of the face, lips, or tongue -breathing problems -changes in vision -confusion -elevated mood, decreased need for sleep, racing thoughts, impulsive behavior -fast or irregular heartbeat -hallucinations, loss of contact with reality -increased blood pressure -redness, blistering, peeling or loosening of the skin, including inside the mouth -seizures -suicidal thoughts or other mood  changes -unusually weak or tired -vomiting Side effects that usually do not require medical attention (report to your doctor or health care professional if they continue or are bothersome): -constipation -headache -loss of appetite -nausea -tremors -weight loss This list may not describe all possible side effects. Call your doctor for medical advice about side effects. You may report side effects to FDA at 1-800-FDA-1088. Where should I keep my medicine? Keep out of the reach of children. Store at room temperature between 15 and 30 degrees C (59 and 86 degrees F). Throw away any unused medicine after the expiration date. NOTE: This sheet is a summary. It may not cover all possible information. If you have questions about this medicine, talk to your doctor, pharmacist, or health care provider.  2017 Elsevier/Gold Standard (2015-06-25 13:55:13)  Trazodone tablets What is this medicine? TRAZODONE (TRAZ oh done) is used to treat depression. This medicine may be used for other purposes; ask your health care provider or pharmacist if you have questions. COMMON BRAND NAME(S): Desyrel What should I tell my health care provider before I take this medicine? They need to know if you have any of these conditions: -attempted suicide or thinking about it -bipolar disorder -bleeding problems -glaucoma -heart disease, or previous heart attack -irregular heart beat -kidney or liver disease -low levels of sodium in the blood -an unusual or allergic reaction to trazodone, other medicines, foods, dyes or preservatives -pregnant or trying to get pregnant -breast-feeding How should I use this medicine? Take this medicine by mouth with a glass of water. Follow the directions on the prescription label. Take this medicine shortly after a meal or a light snack. Take your medicine at regular intervals. Do not take your medicine more often than directed. Do  not stop taking this medicine suddenly except upon  the advice of your doctor. Stopping this medicine too quickly may cause serious side effects or your condition may worsen. A special MedGuide will be given to you by the pharmacist with each prescription and refill. Be sure to read this information carefully each time. Talk to your pediatrician regarding the use of this medicine in children. Special care may be needed. Overdosage: If you think you have taken too much of this medicine contact a poison control center or emergency room at once. NOTE: This medicine is only for you. Do not share this medicine with others. What if I miss a dose? If you miss a dose, take it as soon as you can. If it is almost time for your next dose, take only that dose. Do not take double or extra doses. What may interact with this medicine? Do not take this medicine with any of the following medications: -certain medicines for fungal infections like fluconazole, itraconazole, ketoconazole, posaconazole, voriconazole -cisapride -dofetilide -dronedarone -linezolid -MAOIs like Carbex, Eldepryl, Marplan, Nardil, and Parnate -mesoridazine -methylene blue (injected into a vein) -pimozide -saquinavir -thioridazine -ziprasidone This medicine may also interact with the following medications: -alcohol -antiviral medicines for HIV or AIDS -aspirin and aspirin-like medicines -barbiturates like phenobarbital -certain medicines for blood pressure, heart disease, irregular heart beat -certain medicines for depression, anxiety, or psychotic disturbances -certain medicines for migraine headache like almotriptan, eletriptan, frovatriptan, naratriptan, rizatriptan, sumatriptan, zolmitriptan -certain medicines for seizures like carbamazepine and phenytoin -certain medicines for sleep -certain medicines that treat or prevent blood clots like dalteparin, enoxaparin, warfarin -digoxin -fentanyl -lithium -NSAIDS, medicines for pain and inflammation, like ibuprofen or  naproxen -other medicines that prolong the QT interval (cause an abnormal heart rhythm) -rasagiline -supplements like St. John's wort, kava kava, valerian -tramadol -tryptophan This list may not describe all possible interactions. Give your health care provider a list of all the medicines, herbs, non-prescription drugs, or dietary supplements you use. Also tell them if you smoke, drink alcohol, or use illegal drugs. Some items may interact with your medicine. What should I watch for while using this medicine? Tell your doctor if your symptoms do not get better or if they get worse. Visit your doctor or health care professional for regular checks on your progress. Because it may take several weeks to see the full effects of this medicine, it is important to continue your treatment as prescribed by your doctor. Patients and their families should watch out for new or worsening thoughts of suicide or depression. Also watch out for sudden changes in feelings such as feeling anxious, agitated, panicky, irritable, hostile, aggressive, impulsive, severely restless, overly excited and hyperactive, or not being able to sleep. If this happens, especially at the beginning of treatment or after a change in dose, call your health care professional. Bonita Quin may get drowsy or dizzy. Do not drive, use machinery, or do anything that needs mental alertness until you know how this medicine affects you. Do not stand or sit up quickly, especially if you are an older patient. This reduces the risk of dizzy or fainting spells. Alcohol may interfere with the effect of this medicine. Avoid alcoholic drinks. This medicine may cause dry eyes and blurred vision. If you wear contact lenses you may feel some discomfort. Lubricating drops may help. See your eye doctor if the problem does not go away or is severe. Your mouth may get dry. Chewing sugarless gum, sucking hard candy and drinking plenty of water  may help. Contact your doctor if  the problem does not go away or is severe. What side effects may I notice from receiving this medicine? Side effects that you should report to your doctor or health care professional as soon as possible: -allergic reactions like skin rash, itching or hives, swelling of the face, lips, or tongue -elevated mood, decreased need for sleep, racing thoughts, impulsive behavior -confusion -fast, irregular heartbeat -feeling faint or lightheaded, falls -feeling agitated, angry, or irritable -loss of balance or coordination -painful or prolonged erections -restlessness, pacing, inability to keep still -suicidal thoughts or other mood changes -tremors -trouble sleeping -seizures -unusual bleeding or bruising Side effects that usually do not require medical attention (report to your doctor or health care professional if they continue or are bothersome): -change in sex drive or performance -change in appetite or weight -constipation -headache -muscle aches or pains -nausea This list may not describe all possible side effects. Call your doctor for medical advice about side effects. You may report side effects to FDA at 1-800-FDA-1088. Where should I keep my medicine? Keep out of the reach of children. Store at room temperature between 15 and 30 degrees C (59 to 86 degrees F). Protect from light. Keep container tightly closed. Throw away any unused medicine after the expiration date. NOTE: This sheet is a summary. It may not cover all possible information. If you have questions about this medicine, talk to your doctor, pharmacist, or health care provider.  2017 Elsevier/Gold Standard (2015-06-03 16:57:05)

## 2015-12-01 NOTE — Progress Notes (Signed)
Annual Screening Comprehensive Examination   This very nice 45 y.o.female presents for complete physical.  Patient has no major health issues.  Patient reports no complaints at this time.   Patient has a history of right thyroid nodule which has been monitored by ultrasound.  Last ultrasound was stable and the radiologist recommended no further ultrasound review.  TSH testing are also normal.    She is still seeing obgyn.   She also see Dr. Emily Filbert yearly for a skin check.     She is really struggling with anxiety, depression, and insomnia.  Her stepchild is misbehaving and is in legal trouble, her husband is not communicating with her and she feels like her family is falling apart.  She feels like her only peace of mind is a work where she has a really stressful job.     Finally, patient has history of Vitamin D Deficiency and last vitamin D was  Lab Results  Component Value Date   VD25OH 41 12/01/2014  .  Currently on supplementation     Current Outpatient Prescriptions on File Prior to Visit  Medication Sig Dispense Refill  . Ascorbic Acid (VITAMIN C) 1000 MG tablet Take 1,000 mg by mouth daily.    Marland Kitchen aspirin 81 MG tablet Take 81 mg by mouth daily.    . calcium carbonate (OS-CAL) 600 MG TABS tablet Take 600 mg by mouth 2 (two) times daily with a meal.    . Magnesium 250 MG TABS Take by mouth.    . Multiple Vitamins-Minerals (MULTIVITAMIN WITH MINERALS) tablet Take 1 tablet by mouth. Three times per week    . Red Yeast Rice 600 MG CAPS Take 600 mg by mouth 2 (two) times daily.    . RELPAX 40 MG tablet TAKE 1 TABLET BY MOUTH AS NEEDED FOR MIGRAINE OR HEADACHE MAY REPEAT IN 2 HOURS IF NEEDED 10 tablet 0  . Vitamin D, Cholecalciferol, 1000 UNITS TABS Take 1,000 Units by mouth. Three times per week     No current facility-administered medications on file prior to visit.     Allergies  Allergen Reactions  . Tetracyclines & Related     Hives    Past Medical History:   Diagnosis Date  . Allergy   . Depression   . Esophagitis   . GERD (gastroesophageal reflux disease) 11/25/2013  . Helicobacter pylori ab+   . Hyperlipidemia   . Migraine   . Unspecified vitamin D deficiency     Immunization History  Administered Date(s) Administered  . PPD Test 11/20/2013  . Td 01/17/2004  . Tdap 07/17/2013    Past Surgical History:  Procedure Laterality Date  . GYNECOLOGIC CRYOSURGERY    . MOLE REMOVAL     multiple benign- Dr. Emily Filbert    Family History  Problem Relation Age of Onset  . Hypertension Mother   . Hyperlipidemia Mother   . Hypothyroidism Mother   . Hypothyroidism Father   . Cancer Maternal Grandfather     spine  . Diabetes Other     juvenille  . Colon cancer Neg Hx   . Colon polyps Mother   . Irritable bowel syndrome Mother   . Heart disease Maternal Grandfather   . Kidney disease Neg Hx   . Gallbladder disease Neg Hx   . Esophageal cancer Neg Hx     Social History   Social History  . Marital status: Married    Spouse name: N/A  . Number of children: 2  .  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 Smoking status: Never Smoker  . Smokeless tobacco: Never Used  . Alcohol use 0.0 oz/week     Comment: Socially  . Drug use: No  . Sexual activity: Yes    Birth control/ protection: None   Other Topics Concern  . Not on file   Social History Narrative  . No narrative on file   Review of Systems  Constitutional: Positive for malaise/fatigue. Negative for chills and fever.       Weight gain   HENT: Negative for congestion, ear pain and sore throat.        Choking sensation  Eyes: Negative.   Respiratory: Negative for cough, shortness of breath and wheezing.   Cardiovascular: Negative for chest pain, palpitations and leg swelling.  Gastrointestinal: Negative for abdominal pain, blood in stool, constipation, diarrhea, heartburn and melena.  Genitourinary: Negative.    Skin: Negative.   Neurological: Negative for dizziness, sensory change, loss of consciousness and headaches.  Psychiatric/Behavioral: Positive for depression. The patient is nervous/anxious and has insomnia.       Physical Exam  BP 108/60   Pulse 86   Temp 98 F (36.7 C) (Temporal)   Resp 16   Ht 5' 5.5" (1.664 m)   Wt 183 lb (83 kg)   BMI 29.99 kg/m    Wt Readings from Last 3 Encounters:  12/01/15 183 lb (83 kg)  09/06/15 180 lb (81.6 kg)  04/23/15 168 lb (76.2 kg)     General Appearance: Well nourished and in no apparent distress. Eyes: PERRLA, EOMs, conjunctiva no swelling or erythema, normal fundi and vessels. Sinuses: No frontal/maxillary tenderness ENT/Mouth: EACs patent / TMs  nl. Nares clear without erythema, swelling, mucoid exudates. Oral hygiene is good. No erythema, swelling, or exudate. Tongue normal, non-obstructing. Tonsils not swollen or erythematous. Hearing normal.  Neck: Supple, thyroid normal. No bruits, nodes or JVD. Respiratory: Respiratory effort normal.  BS equal and clear bilateral without rales, rhonci, wheezing or stridor. Cardio: Heart sounds are normal with regular rate and rhythm and no murmurs, rubs or gallops. Peripheral pulses are normal and equal bilaterally without edema. No aortic or femoral bruits. Chest: symmetric with normal excursions and percussion. Abdomen: Flat, soft, with bowl sounds. Nontender, no guarding, rebound, hernias, masses, or organomegaly.  Lymphatics: Non tender without lymphadenopathy.  Musculoskeletal: Full ROM all peripheral extremities, joint stability, 5/5 strength, and normal gait. Skin: Warm and dry without rashes, lesions, cyanosis, clubbing or  ecchymosis.  Neuro: Cranial nerves intact, reflexes equal bilaterally. Normal muscle tone, no cerebellar symptoms. Sensation intact.  Pysch: Awake and oriented X 3, normal affect, Insight and Judgment appropriate.   Assessment and Plan   1. Routine general medical  examination at a health care facility  - CBC with Differential/Platelet - BASIC METABOLIC PANEL WITH GFR - Hepatic function panel - Magnesium  2. Screening for hyperlipidemia -cont diet and exercise -high fiber diet recommended - Lipid panel  3. Screening for diabetes mellitus  - Hemoglobin A1c - Insulin, random  4. Screening for deficiency anemia  - Iron and TIBC - Vitamin B12  5. Screening for hematuria or proteinuria  - Urinalysis, Routine w reflex microscopic ([facility]) - Microalbumin / creatinine urine ratio  6. Vitamin D deficiency -cont Vit D - VITAMIN D 25 Hydroxy (Vit-D Deficiency, Fractures)  7. Screening for thyroid disorder  - TSH  8. Moderate episode of  recurrent major depressive disorder (HCC) -wellbutrin xl -trazodone at bedtime -referred to counselors -suggested marriage counseling -recommended she take 30 minutes to herself daily out of the house to help with decompression      Continue prudent diet as discussed, weight control, regular exercise, and medications. Routine screening labs and tests as requested with regular follow-up as recommended.  Over 40 minutes of exam, counseling, chart review and critical decision making was performed

## 2015-12-02 LAB — URINALYSIS, ROUTINE W REFLEX MICROSCOPIC
Bilirubin Urine: NEGATIVE
Glucose, UA: NEGATIVE
Hgb urine dipstick: NEGATIVE
Ketones, ur: NEGATIVE
Nitrite: NEGATIVE
Protein, ur: NEGATIVE
Specific Gravity, Urine: 1.009 (ref 1.001–1.035)
pH: 7.5 (ref 5.0–8.0)

## 2015-12-02 LAB — URINALYSIS, MICROSCOPIC ONLY
Bacteria, UA: NONE SEEN [HPF]
Casts: NONE SEEN [LPF]
Crystals: NONE SEEN [HPF]
RBC / HPF: NONE SEEN RBC/HPF (ref <=2)
WBC, UA: NONE SEEN WBC/HPF (ref <=5)
Yeast: NONE SEEN [HPF]

## 2015-12-02 LAB — HEMOGLOBIN A1C
Hgb A1c MFr Bld: 5 % (ref <5.7)
Mean Plasma Glucose: 97 mg/dL

## 2015-12-02 LAB — VITAMIN D 25 HYDROXY (VIT D DEFICIENCY, FRACTURES): Vit D, 25-Hydroxy: 34 ng/mL (ref 30–100)

## 2015-12-02 LAB — MICROALBUMIN / CREATININE URINE RATIO
Creatinine, Urine: 39 mg/dL (ref 20–320)
Microalb Creat Ratio: 5 mcg/mg creat (ref <30)
Microalb, Ur: 0.2 mg/dL

## 2015-12-02 LAB — INSULIN, RANDOM: Insulin: 13 u[IU]/mL (ref 2.0–19.6)

## 2015-12-12 NOTE — Addendum Note (Signed)
Addended by: HELMER, REBECCA A on: 12/12/2015 06:03 PM   Modules accepted: Orders

## 2016-01-04 ENCOUNTER — Encounter: Payer: Self-pay | Admitting: Internal Medicine

## 2016-01-04 ENCOUNTER — Ambulatory Visit (INDEPENDENT_AMBULATORY_CARE_PROVIDER_SITE_OTHER): Payer: 59 | Admitting: Internal Medicine

## 2016-01-04 DIAGNOSIS — F331 Major depressive disorder, recurrent, moderate: Secondary | ICD-10-CM

## 2016-01-04 MED ORDER — BENZONATATE 100 MG PO CAPS: 100.0000 mg | ORAL_CAPSULE | Freq: Three times a day (TID) | ORAL | 1 refills | Status: DC | PRN

## 2016-01-04 MED ORDER — BUPROPION HCL ER (XL) 150 MG PO TB24: 150.0000 mg | ORAL_TABLET | ORAL | 2 refills | Status: DC

## 2016-01-04 MED ORDER — TRAZODONE HCL 100 MG PO TABS: 100.0000 mg | ORAL_TABLET | Freq: Every day | ORAL | 2 refills | Status: DC

## 2016-01-04 NOTE — Progress Notes (Signed)
Assessment and Plan:   1. Moderate episode of recurrent major depressive disorder (HCC) -cont medications -seems to be much improved -starts counseling in January - buPROPion (WELLBUTRIN XL) 150 MG 24 hr tablet; Take 1 tablet (150 mg total) by mouth every morning.  Dispense: 90 tablet; Refill: 2 - traZODone (DESYREL) 100 MG tablet; Take 1 tablet (100 mg total) by mouth at bedtime.  Dispense: 90 tablet; Refill: 2     HPI 45 y.o.female presents for 1 month follow up of new start of wellbutrin and also trazodone.  She reports that she is in more control of her emotions and also her appetite.  She reports that she has also been sleeping a lot better.  She is not having any side effects.  She is losing some weight on it as well. Patient reports that they have been doing well.  female is taking their medication. She feels that there are no bad side effects to the medication.  She is able to make better choices for herself in her diet.    Past Medical History:  Diagnosis Date  . Allergy   . Depression   . Esophagitis   . GERD (gastroesophageal reflux disease) 11/25/2013  . Helicobacter pylori ab+   . Hyperlipidemia   . Migraine   . Unspecified vitamin D deficiency      Allergies  Allergen Reactions  . Tetracyclines & Related     Hives      Current Outpatient Prescriptions on File Prior to Visit  Medication Sig Dispense Refill  . Ascorbic Acid (VITAMIN C) 1000 MG tablet Take 1,000 mg by mouth daily.    Marland Kitchen. aspirin 81 MG tablet Take 81 mg by mouth daily.    Marland Kitchen. buPROPion (WELLBUTRIN XL) 150 MG 24 hr tablet Take 1 tablet (150 mg total) by mouth every morning. 90 tablet 0  . calcium carbonate (OS-CAL) 600 MG TABS tablet Take 600 mg by mouth 2 (two) times daily with a meal.    . Magnesium 250 MG TABS Take by mouth.    . Multiple Vitamins-Minerals (MULTIVITAMIN WITH MINERALS) tablet Take 1 tablet by mouth. Three times per week    . Red Yeast Rice 600 MG CAPS Take 600 mg by mouth 2 (two)  times daily.    . RELPAX 40 MG tablet TAKE 1 TABLET BY MOUTH AS NEEDED FOR MIGRAINE OR HEADACHE MAY REPEAT IN 2 HOURS IF NEEDED 10 tablet 0  . traZODone (DESYREL) 100 MG tablet Take 1 tablet (100 mg total) by mouth at bedtime. 90 tablet 0  . Vitamin D, Cholecalciferol, 1000 UNITS TABS Take 1,000 Units by mouth. Three times per week    . Zinc 50 MG TABS Take by mouth daily.     No current facility-administered medications on file prior to visit.     ROS: all negative except above.   Physical Exam: Filed Weights   01/04/16 0937  Weight: 175 lb (79.4 kg)   Wt Readings from Last 3 Encounters:  01/04/16 175 lb (79.4 kg)  12/01/15 183 lb (83 kg)  09/06/15 180 lb (81.6 kg)    BP 102/60   Pulse 62   Temp 98.2 F (36.8 C) (Temporal)   Resp 16   Ht 5' 5.5" (1.664 m)   Wt 175 lb (79.4 kg)   BMI 28.68 kg/m  General Appearance: Well developed well nourished, non-toxic appearing in no apparent distress. Eyes: PERRLA, EOMs, conjunctiva w/ no swelling or erythema or discharge Sinuses: No Frontal/maxillary tenderness ENT/Mouth: Ear  canals clear without swelling or erythema.  TM's normal bilaterally with no retractions, bulging, or loss of landmarks.   Neck: Supple, thyroid normal, no notable JVD  Respiratory: Respiratory effort normal, Clear breath sounds anteriorly and posteriorly bilaterally without rales, rhonchi, wheezing or stridor. No retractions or accessory muscle usage. Cardio: RRR with no MRGs.   Abdomen: Soft, + BS.  Non tender, no guarding, rebound, hernias, masses.  Musculoskeletal: Full ROM, 5/5 strength, normal gait.  Skin: Warm, dry without rashes  Neuro: Awake and oriented X 3, Cranial nerves intact. Normal muscle tone, no cerebellar symptoms. Sensation intact.  Psych: normal affect, Insight and Judgment appropriate.     Shawna Piedraourtney Forcucci, PA-C 9:48 AM [facility]

## 2016-02-09 ENCOUNTER — Other Ambulatory Visit: Payer: Self-pay | Admitting: Internal Medicine

## 2016-02-23 ENCOUNTER — Encounter: Payer: Self-pay | Admitting: Internal Medicine

## 2016-02-23 ENCOUNTER — Ambulatory Visit (INDEPENDENT_AMBULATORY_CARE_PROVIDER_SITE_OTHER): Payer: BLUE CROSS/BLUE SHIELD | Admitting: Internal Medicine

## 2016-02-23 VITALS — BP 112/64 | HR 80 | Temp 98.2°F | Resp 16 | Ht 65.5 in

## 2016-02-23 DIAGNOSIS — J0111 Acute recurrent frontal sinusitis: Secondary | ICD-10-CM | POA: Diagnosis not present

## 2016-02-23 MED ORDER — PREDNISONE 20 MG PO TABS: ORAL_TABLET | ORAL | 0 refills | Status: DC

## 2016-02-23 MED ORDER — MECLIZINE HCL 25 MG PO TABS: 25.0000 mg | ORAL_TABLET | Freq: Three times a day (TID) | ORAL | 1 refills | Status: DC | PRN

## 2016-02-23 MED ORDER — MONTELUKAST SODIUM 10 MG PO TABS: 10.0000 mg | ORAL_TABLET | Freq: Every day | ORAL | 2 refills | Status: DC

## 2016-02-23 MED ORDER — AZITHROMYCIN 250 MG PO TABS: ORAL_TABLET | ORAL | 1 refills | Status: DC

## 2016-02-23 MED ORDER — AZELASTINE HCL 0.1 % NA SOLN: 2.0000 | Freq: Two times a day (BID) | NASAL | 2 refills | Status: DC

## 2016-02-23 NOTE — Progress Notes (Signed)
HPI  Patient presents to the office for evaluation of increasing sinus pressure and pain for 2 months. They also endorse minimal coughing.    She has been having some nasal congestion, thick green nasal sputum, dizziness, ear pain, dizziness.  They have tried flonase, nasal saline, antihistamine.  They report that nothing has worked.  They denies other sick contacts.  Review of Systems  Constitutional: Positive for malaise/fatigue. Negative for chills and fever.  HENT: Positive for congestion, ear pain, hearing loss and sore throat.   Respiratory: Positive for cough. Negative for sputum production, shortness of breath and wheezing.   Cardiovascular: Negative for chest pain, palpitations and leg swelling.  Neurological: Positive for headaches.    PE:  General:  Alert and non-toxic, WDWN, NAD HEENT: NCAT, PERLA, EOM normal, no occular discharge or erythema.  Nasal mucosal edema with sinus tenderness to palpation.  Oropharynx clear with minimal oropharyngeal edema and erythema.  Mucous membranes moist and pink. Neck:  Cervical adenopathy Chest:  RRR no MRGs.  Lungs clear to auscultation A&P with no wheezes rhonchi or rales.   Abdomen: +BS x 4 quadrants, soft, non-tender, no guarding, rigidity, or rebound. Skin: warm and dry no rash Neuro: A&Ox4, CN II-XII grossly intact  Assessment and Plan:   1. Acute recurrent frontal sinusitis -zpak sent in to be taken only if no clearance of infection with symptomatic treatment.   -cont nasal saline -cont flonase -cont daily antihistamine.   - azelastine (ASTELIN) 0.1 % nasal spray; Place 2 sprays into both nostrils 2 (two) times daily. Use in each nostril as directed  Dispense: 30 mL; Refill: 2 - predniSONE (DELTASONE) 20 MG tablet; 3 tabs po daily x 3 days, then 2 tabs x 3 days, then 1.5 tabs x 3 days, then 1 tab x 3 days, then 0.5 tabs x 3 days  Dispense: 27 tablet; Refill: 0 - montelukast (SINGULAIR) 10 MG tablet; Take 1 tablet (10 mg total) by  mouth daily.  Dispense: 30 tablet; Refill: 2 - meclizine (ANTIVERT) 25 MG tablet; Take 1 tablet (25 mg total) by mouth 3 (three) times daily as needed for dizziness.  Dispense: 30 tablet; Refill: 1

## 2016-03-05 ENCOUNTER — Other Ambulatory Visit: Payer: Self-pay | Admitting: Internal Medicine

## 2016-03-05 DIAGNOSIS — J0111 Acute recurrent frontal sinusitis: Secondary | ICD-10-CM

## 2016-05-02 DIAGNOSIS — Z6828 Body mass index (BMI) 28.0-28.9, adult: Secondary | ICD-10-CM | POA: Diagnosis not present

## 2016-05-02 DIAGNOSIS — Z01419 Encounter for gynecological examination (general) (routine) without abnormal findings: Secondary | ICD-10-CM | POA: Diagnosis not present

## 2016-05-02 DIAGNOSIS — Z1231 Encounter for screening mammogram for malignant neoplasm of breast: Secondary | ICD-10-CM | POA: Diagnosis not present

## 2016-05-31 ENCOUNTER — Encounter: Payer: Self-pay | Admitting: *Deleted

## 2016-06-27 DIAGNOSIS — F321 Major depressive disorder, single episode, moderate: Secondary | ICD-10-CM | POA: Diagnosis not present

## 2016-07-31 DIAGNOSIS — F321 Major depressive disorder, single episode, moderate: Secondary | ICD-10-CM | POA: Diagnosis not present

## 2016-08-23 DIAGNOSIS — F321 Major depressive disorder, single episode, moderate: Secondary | ICD-10-CM | POA: Diagnosis not present

## 2016-09-13 DIAGNOSIS — F321 Major depressive disorder, single episode, moderate: Secondary | ICD-10-CM | POA: Diagnosis not present

## 2016-10-03 DIAGNOSIS — F321 Major depressive disorder, single episode, moderate: Secondary | ICD-10-CM | POA: Diagnosis not present

## 2016-10-30 DIAGNOSIS — F321 Major depressive disorder, single episode, moderate: Secondary | ICD-10-CM | POA: Diagnosis not present

## 2016-11-28 ENCOUNTER — Encounter: Payer: Self-pay | Admitting: Adult Health

## 2016-11-28 DIAGNOSIS — F321 Major depressive disorder, single episode, moderate: Secondary | ICD-10-CM | POA: Diagnosis not present

## 2016-11-28 NOTE — Progress Notes (Signed)
Complete Physical  Assessment and Plan:  Diagnoses and all orders for this visit:  Encounter for routine adult health examination with abnormal findings  Gastroesophageal reflux disease without esophagitis Well managed by lifestyle; uses tums only occasionally.  -     Magnesium  Right thyroid nodule      -      Stable on repeated US; continue to monitor with exam and TSH. Patient asymptomatic for thyroid issues.   Mixed hyperlipidemia Continue low cholesterol diet, red rice yeast supplement -     TSH -     Lipid panel  Vitamin D deficiency Continue supplementation -     VITAMIN D 25 Hydroxy (Vit-D Deficiency, Fractures)  Insomnia        -      Well managed by nightly trazodone       -      Diet/exercise, sleep hygiene discussed  Screening for diabetes mellitus -     Hemoglobin A1c -     Insulin, random  Screening for hematuria or proteinuria -     Urinalysis, Complete (81001)  Medication management -     CBC with Differential/Platelet -     BASIC METABOLIC PANEL WITH GFR -     Hepatic function panel -     Urinalysis, Complete (81001)  Screening for cardiovascular condition -     EKG 12-Lead   Discussed med's effects and SE's. Screening labs and tests as requested with regular follow-up as recommended. Over 40 minutes of exam, counseling, chart review, and complex, high level critical decision making was performed this visit.   Future Appointments  Date Time Provider Department Center  11/29/2017  9:00 AM Judd Gaudier, NP [facility] None    HPI  46 y.o. Caucasian female, married, 2 kids, presents for a complete physical and follow up for has Mixed hyperlipidemia; Vitamin D deficiency; Right thyroid nodule; and GERD (gastroesophageal reflux disease) on their problem list.. She reports stress levels are significantly improved from last year; she is seeing a counselor every three weeks and is participating in a regular exercise program and weight watchers. She is  off of wellbutrin which was prescribed for depression and doing very well. She continues with trazodone nightly which helps he fall back asleep and has significantly improved quality of life. She reports headaches are down from monthly to less than 3 a year - resolve quickly with single tab of relpax. She sees GYN annually for PAPs and 3D mammogram. No issues/concerns today.   BMI is Body mass index is 28.62 kg/m., she has been working on diet and exercise. Wt Readings from Last 3 Encounters:  11/29/16 172 lb (78 kg)  01/04/16 175 lb (79.4 kg)  12/01/15 183 lb (83 kg)   Today their BP is BP: 102/62 She does workout. She denies chest pain, shortness of breath, dizziness.   She is not on cholesterol medication (on red yeast rice supplement) and denies myalgias. Her cholesterol is not at goal. The cholesterol last visit was:   Lab Results  Component Value Date   CHOL 210 (H) 12/01/2015   HDL 73 12/01/2015   LDLCALC 112 (H) 12/01/2015   TRIG 123 12/01/2015   CHOLHDL 2.9 12/01/2015    Last A1C in the office was:  Lab Results  Component Value Date   HGBA1C 5.0 12/01/2015   Last GFR: Lab Results  Component Value Date   GFRNONAA 86 12/01/2015   Patient is on Vitamin D supplement and below goal at the  last visit:    Lab Results  Component Value Date   VD25OH 34 12/01/2015      Current Medications:  Current Outpatient Medications on File Prior to Visit  Medication Sig Dispense Refill  . Ascorbic Acid (VITAMIN C) 1000 MG tablet Take 1,000 mg by mouth daily.    Marland Kitchen. aspirin 81 MG tablet Take 81 mg by mouth daily.    . calcium carbonate (OS-CAL) 600 MG TABS tablet Take 600 mg by mouth 2 (two) times daily with a meal.    . fluticasone (FLONASE) 50 MCG/ACT nasal spray Place into both nostrils daily.    . Magnesium 250 MG TABS Take by mouth.    . Multiple Vitamins-Minerals (MULTIVITAMIN WITH MINERALS) tablet Take 1 tablet by mouth. Three times per week    . OVER THE COUNTER MEDICATION 1  tablet daily. [employer] brand allergy pill    . Red Yeast Rice 600 MG CAPS Take 600 mg by mouth 2 (two) times daily.    . RELPAX 40 MG tablet TAKE 1 TABLET BY MOUTH AS NEEDED FOR MIGRAINE OR HEADACHE MAY REPEAT IN 2 HOURS IF NEEDED 10 tablet 0  . traZODone (DESYREL) 100 MG tablet Take 1 tablet (100 mg total) by mouth at bedtime. 90 tablet 2  . Vitamin D, Cholecalciferol, 1000 UNITS TABS Take 1,000 Units by mouth. Three times per week    . Zinc 50 MG TABS Take by mouth daily.    Marland Kitchen. azelastine (ASTELIN) 0.1 % nasal spray Place 2 sprays into both nostrils 2 (two) times daily. Use in each nostril as directed (Patient not taking: Reported on 11/29/2016) 30 mL 2  . azithromycin (ZITHROMAX) 250 MG tablet Take 2 tablets (500 mg) on  Day 1,  followed by 1 tablet (250 mg) once daily on Days 2 through 5. 6 each 1  . buPROPion (WELLBUTRIN XL) 150 MG 24 hr tablet Take 1 tablet (150 mg total) by mouth every morning. (Patient not taking: Reported on 11/29/2016) 90 tablet 2  . FLUCELVAX QUADRIVALENT 0.5 ML SUSY TO BE ADMINISTERED BY PHARMACIST FOR IMMUNIZATION  0  . meclizine (ANTIVERT) 25 MG tablet Take 1 tablet (25 mg total) by mouth 3 (three) times daily as needed for dizziness. 30 tablet 1  . montelukast (SINGULAIR) 10 MG tablet Take 1 tablet (10 mg total) by mouth daily. (Patient not taking: Reported on 11/29/2016) 30 tablet 2  . predniSONE (DELTASONE) 20 MG tablet 3TABS DAILYX3DAYS,2 TABS X3DAYS,1.5 TABS X3 DAYS,1 TAB X3DAYS & 1/2 TAB X3 DAYS 27 tablet 0   No current facility-administered medications on file prior to visit.    Allergies:  Allergies  Allergen Reactions  . Tetracyclines & Related     Hives   Medical History:  She has Mixed hyperlipidemia; Vitamin D deficiency; Right thyroid nodule; and GERD (gastroesophageal reflux disease) on their problem list. Health Maintenance:   Immunization History  Administered Date(s) Administered  . Influenza-Unspecified 11/23/2016  . PPD Test 11/20/2013  . Td  01/17/2004  . Tdap 07/17/2013   Tetanus: 2015 Flu vaccine: 11/23/2016 Zostavax: n/a LMP: No LMP recorded. Patient is not currently having periods (Reason: IUD). Pap: 2018 Sees GYN annually  MGM: 2018 - 3D performed in Dr. Carmin RichmondLow's office - no issues  Colonoscopy: n/a EGD: 2016  Last Dental Exam: goes q 6 scheduled next week - no issues Last Eye Exam: Annually - Dr. Sharlot GowdaKoop - [facility] - no issues Last Skin Exam: Annually with Dr. Emily FilbertGould   Patient Care Team:  Lucky CowboyMcKeown, William, MD as PCP - General (Internal Medicine) Candice CampLowe, David, MD as Consulting Physician (Obstetrics and Gynecology) Amelia JoLewit, Eliot, MD as Consulting Physician (Neurology) Elmon ElseGould, Karen, MD as Consulting Physician (Dermatology)  Surgical History:  She has a past surgical history that includes Gynecologic cryosurgery and Mole removal. Family History:  Herfamily history includes Cancer in her maternal grandfather; Colon polyps in her mother; Diabetes in her other; Heart disease in her maternal grandfather; Hyperlipidemia in her mother; Hypertension in her mother; Hypothyroidism in her father and mother; Irritable bowel syndrome in her mother. Social History:  She reports that  has never smoked. she has never used smokeless tobacco. She reports that she drinks alcohol. She reports that she does not use drugs.  Review of Systems: Review of Systems  Constitutional: Negative for malaise/fatigue and weight loss.  HENT: Negative for hearing loss and tinnitus.   Eyes: Negative for blurred vision and double vision.  Respiratory: Negative for cough, shortness of breath and wheezing.   Cardiovascular: Negative for chest pain, palpitations, orthopnea, claudication and leg swelling.  Gastrointestinal: Negative for abdominal pain, blood in stool, constipation, diarrhea, heartburn, melena, nausea and vomiting.  Genitourinary: Negative.   Musculoskeletal: Negative for joint pain and myalgias.  Skin: Negative for rash.  Neurological:  Negative for dizziness, tingling, sensory change, weakness and headaches.  Endo/Heme/Allergies: Negative for polydipsia.  Psychiatric/Behavioral: Negative.  Negative for depression and substance abuse. The patient is not nervous/anxious and does not have insomnia.   All other systems reviewed and are negative.   Physical Exam: Estimated body mass index is 28.62 kg/m as calculated from the following:   Height as of this encounter: 5\' 5"  (1.651 m).   Weight as of this encounter: 172 lb (78 kg). BP 102/62   Pulse 64   Temp (!) 97.5 F (36.4 C)   Ht 5\' 5"  (1.651 m)   Wt 172 lb (78 kg)   SpO2 99%   BMI 28.62 kg/m  General Appearance: Well nourished, in no apparent distress.  Eyes: PERRLA, EOMs, conjunctiva no swelling or erythema, normal fundi and vessels.  Sinuses: No Frontal/maxillary tenderness  ENT/Mouth: Ext aud canals clear, normal light reflex with TMs without erythema, bulging. Good dentition. No erythema, swelling, or exudate on post pharynx. Tonsils not swollen or erythematous. Hearing normal.  Neck: Supple, thyroid normal. No bruits  Respiratory: Respiratory effort normal, BS equal bilaterally without rales, rhonchi, wheezing or stridor.  Cardio: RRR without murmurs, rubs or gallops. Brisk peripheral pulses without edema.  Chest: symmetric, with normal excursions and percussion.  Breasts: Defer to GYN Abdomen: Soft, nontender, no guarding, rebound, hernias, masses, or organomegaly.  Lymphatics: Non tender without lymphadenopathy.  Genitourinary: Defer to GYN Musculoskeletal: Full ROM all peripheral extremities,5/5 strength, and normal gait.  Skin: Warm, dry without rashes, lesions, ecchymosis. Neuro: Cranial nerves intact, reflexes equal bilaterally. Normal muscle tone, no cerebellar symptoms. Sensation intact.  Psych: Awake and oriented X 3, normal affect, Insight and Judgment appropriate.   EKG: Sinus brady -WNL- no ST changes.    Carlyon ShadowAshley C Afnan Emberton 9:12 AM [facility]

## 2016-11-29 ENCOUNTER — Encounter: Payer: Self-pay | Admitting: Adult Health

## 2016-11-29 ENCOUNTER — Ambulatory Visit (INDEPENDENT_AMBULATORY_CARE_PROVIDER_SITE_OTHER): Payer: BLUE CROSS/BLUE SHIELD | Admitting: Adult Health

## 2016-11-29 VITALS — BP 102/62 | HR 64 | Temp 97.5°F | Ht 65.0 in | Wt 172.0 lb

## 2016-11-29 DIAGNOSIS — Z79899 Other long term (current) drug therapy: Secondary | ICD-10-CM | POA: Diagnosis not present

## 2016-11-29 DIAGNOSIS — Z0001 Encounter for general adult medical examination with abnormal findings: Secondary | ICD-10-CM

## 2016-11-29 DIAGNOSIS — I10 Essential (primary) hypertension: Secondary | ICD-10-CM | POA: Diagnosis not present

## 2016-11-29 DIAGNOSIS — Z0000 Encounter for general adult medical examination without abnormal findings: Secondary | ICD-10-CM | POA: Diagnosis not present

## 2016-11-29 DIAGNOSIS — Z136 Encounter for screening for cardiovascular disorders: Secondary | ICD-10-CM | POA: Diagnosis not present

## 2016-11-29 DIAGNOSIS — K219 Gastro-esophageal reflux disease without esophagitis: Secondary | ICD-10-CM

## 2016-11-29 DIAGNOSIS — E559 Vitamin D deficiency, unspecified: Secondary | ICD-10-CM

## 2016-11-29 DIAGNOSIS — E782 Mixed hyperlipidemia: Secondary | ICD-10-CM

## 2016-11-29 DIAGNOSIS — G4700 Insomnia, unspecified: Secondary | ICD-10-CM | POA: Insufficient documentation

## 2016-11-29 DIAGNOSIS — Z130 Encounter for screening for diseases of the blood and blood-forming organs and certain disorders involving the immune mechanism: Secondary | ICD-10-CM

## 2016-11-29 DIAGNOSIS — Z1389 Encounter for screening for other disorder: Secondary | ICD-10-CM

## 2016-11-29 DIAGNOSIS — E041 Nontoxic single thyroid nodule: Secondary | ICD-10-CM

## 2016-11-29 DIAGNOSIS — Z131 Encounter for screening for diabetes mellitus: Secondary | ICD-10-CM

## 2016-11-29 NOTE — Patient Instructions (Signed)

## 2016-11-30 ENCOUNTER — Encounter: Payer: Self-pay | Admitting: Internal Medicine

## 2016-11-30 ENCOUNTER — Encounter: Payer: Self-pay | Admitting: Adult Health

## 2016-11-30 LAB — URINALYSIS, COMPLETE
Bacteria, UA: NONE SEEN /HPF
Bilirubin Urine: NEGATIVE
Glucose, UA: NEGATIVE
Hgb urine dipstick: NEGATIVE
Hyaline Cast: NONE SEEN /LPF
Ketones, ur: NEGATIVE
Leukocytes, UA: NEGATIVE
Nitrite: NEGATIVE
Protein, ur: NEGATIVE
Specific Gravity, Urine: 1.026 (ref 1.001–1.03)
WBC, UA: NONE SEEN /HPF (ref 0–5)
pH: 6.5 (ref 5.0–8.0)

## 2016-11-30 LAB — CBC WITH DIFFERENTIAL/PLATELET
Basophils Absolute: 53 cells/uL (ref 0–200)
Basophils Relative: 0.7 %
Eosinophils Absolute: 68 cells/uL (ref 15–500)
Eosinophils Relative: 0.9 %
HCT: 41 % (ref 35.0–45.0)
Hemoglobin: 13.7 g/dL (ref 11.7–15.5)
Lymphs Abs: 2006 cells/uL (ref 850–3900)
MCH: 29.5 pg (ref 27.0–33.0)
MCHC: 33.4 g/dL (ref 32.0–36.0)
MCV: 88.2 fL (ref 80.0–100.0)
MPV: 11.5 fL (ref 7.5–12.5)
Monocytes Relative: 6.6 %
Neutro Abs: 4970 cells/uL (ref 1500–7800)
Neutrophils Relative %: 65.4 %
Platelets: 273 10*3/uL (ref 140–400)
RBC: 4.65 10*6/uL (ref 3.80–5.10)
RDW: 12 % (ref 11.0–15.0)
Total Lymphocyte: 26.4 %
WBC mixed population: 502 cells/uL (ref 200–950)
WBC: 7.6 10*3/uL (ref 3.8–10.8)

## 2016-11-30 LAB — BASIC METABOLIC PANEL WITH GFR
BUN: 12 mg/dL (ref 7–25)
CO2: 27 mmol/L (ref 20–32)
Calcium: 9.2 mg/dL (ref 8.6–10.2)
Chloride: 105 mmol/L (ref 98–110)
Creat: 0.72 mg/dL (ref 0.50–1.10)
GFR, Est African American: 117 mL/min/{1.73_m2} (ref >=60)
GFR, Est Non African American: 101 mL/min/{1.73_m2} (ref >=60)
Glucose, Bld: 94 mg/dL (ref 65–99)
Potassium: 4.1 mmol/L (ref 3.5–5.3)
Sodium: 140 mmol/L (ref 135–146)

## 2016-11-30 LAB — HEPATIC FUNCTION PANEL
AG Ratio: 1.5 (calc) (ref 1.0–2.5)
ALT: 16 U/L (ref 6–29)
AST: 20 U/L (ref 10–35)
Albumin: 4.3 g/dL (ref 3.6–5.1)
Alkaline phosphatase (APISO): 44 U/L (ref 33–115)
Bilirubin, Direct: 0.1 mg/dL (ref 0.0–0.2)
Globulin: 2.8 g/dL (calc) (ref 1.9–3.7)
Indirect Bilirubin: 0.4 mg/dL (calc) (ref 0.2–1.2)
Total Bilirubin: 0.5 mg/dL (ref 0.2–1.2)
Total Protein: 7.1 g/dL (ref 6.1–8.1)

## 2016-11-30 LAB — VITAMIN D 25 HYDROXY (VIT D DEFICIENCY, FRACTURES): Vit D, 25-Hydroxy: 37 ng/mL (ref 30–100)

## 2016-11-30 LAB — HEMOGLOBIN A1C
Hgb A1c MFr Bld: 5 % of total Hgb (ref <5.7)
Mean Plasma Glucose: 97 (calc)
eAG (mmol/L): 5.4 (calc)

## 2016-11-30 LAB — LIPID PANEL
Cholesterol: 201 mg/dL — ABNORMAL HIGH (ref <200)
HDL: 70 mg/dL (ref >50)
LDL Cholesterol (Calc): 111 mg/dL (calc) — ABNORMAL HIGH
Non-HDL Cholesterol (Calc): 131 mg/dL (calc) — ABNORMAL HIGH (ref <130)
Total CHOL/HDL Ratio: 2.9 (calc) (ref <5.0)
Triglycerides: 101 mg/dL (ref <150)

## 2016-11-30 LAB — INSULIN, RANDOM: Insulin: 17.4 u[IU]/mL (ref 2.0–19.6)

## 2016-11-30 LAB — TSH: TSH: 0.44 mIU/L

## 2016-11-30 LAB — MAGNESIUM: Magnesium: 2.3 mg/dL (ref 1.5–2.5)

## 2016-12-19 ENCOUNTER — Other Ambulatory Visit: Payer: Self-pay

## 2016-12-19 DIAGNOSIS — F331 Major depressive disorder, recurrent, moderate: Secondary | ICD-10-CM

## 2016-12-19 MED ORDER — TRAZODONE HCL 100 MG PO TABS: 100.0000 mg | ORAL_TABLET | Freq: Every day | ORAL | 2 refills | Status: DC

## 2016-12-19 NOTE — Telephone Encounter (Signed)
Refill request for Trazadone 100mg  tablet. Take 1 tablet by mouth at bedtime. #90 with 2 refills  Last office visit on 11/29/16. Next office visit on 11/29/17

## 2017-01-03 DIAGNOSIS — F321 Major depressive disorder, single episode, moderate: Secondary | ICD-10-CM | POA: Diagnosis not present

## 2017-01-12 ENCOUNTER — Other Ambulatory Visit: Payer: Self-pay | Admitting: Physician Assistant

## 2017-01-30 DIAGNOSIS — F321 Major depressive disorder, single episode, moderate: Secondary | ICD-10-CM | POA: Diagnosis not present

## 2017-02-15 ENCOUNTER — Other Ambulatory Visit: Payer: Self-pay

## 2017-02-15 DIAGNOSIS — J0111 Acute recurrent frontal sinusitis: Secondary | ICD-10-CM

## 2017-02-15 MED ORDER — MECLIZINE HCL 25 MG PO TABS: 25.0000 mg | ORAL_TABLET | Freq: Three times a day (TID) | ORAL | 1 refills | Status: DC | PRN

## 2017-02-15 NOTE — Telephone Encounter (Signed)
Patient requesting a prescription for Meclizine. Patient is going deep sea fishing and would like to take this with her.

## 2017-03-13 DIAGNOSIS — F321 Major depressive disorder, single episode, moderate: Secondary | ICD-10-CM | POA: Diagnosis not present

## 2017-05-03 DIAGNOSIS — F321 Major depressive disorder, single episode, moderate: Secondary | ICD-10-CM | POA: Diagnosis not present

## 2017-06-21 DIAGNOSIS — F321 Major depressive disorder, single episode, moderate: Secondary | ICD-10-CM | POA: Diagnosis not present

## 2017-08-09 DIAGNOSIS — F321 Major depressive disorder, single episode, moderate: Secondary | ICD-10-CM | POA: Diagnosis not present

## 2017-09-08 ENCOUNTER — Other Ambulatory Visit: Payer: Self-pay | Admitting: Adult Health

## 2017-09-08 DIAGNOSIS — F331 Major depressive disorder, recurrent, moderate: Secondary | ICD-10-CM

## 2017-09-13 DIAGNOSIS — Z1231 Encounter for screening mammogram for malignant neoplasm of breast: Secondary | ICD-10-CM | POA: Diagnosis not present

## 2017-09-13 DIAGNOSIS — Z6830 Body mass index (BMI) 30.0-30.9, adult: Secondary | ICD-10-CM | POA: Diagnosis not present

## 2017-09-13 DIAGNOSIS — Z01419 Encounter for gynecological examination (general) (routine) without abnormal findings: Secondary | ICD-10-CM | POA: Diagnosis not present

## 2017-10-18 DIAGNOSIS — F321 Major depressive disorder, single episode, moderate: Secondary | ICD-10-CM | POA: Diagnosis not present

## 2017-11-20 DIAGNOSIS — F321 Major depressive disorder, single episode, moderate: Secondary | ICD-10-CM | POA: Diagnosis not present

## 2017-11-28 DIAGNOSIS — E663 Overweight: Secondary | ICD-10-CM | POA: Insufficient documentation

## 2017-11-28 DIAGNOSIS — E669 Obesity, unspecified: Secondary | ICD-10-CM | POA: Insufficient documentation

## 2017-11-28 NOTE — Progress Notes (Signed)
Complete Physical  Assessment and Plan:  Diagnoses and all orders for this visit:  Encounter for routine adult health examination with abnormal findings  Gastroesophageal reflux disease without esophagitis Well managed by lifestyle; uses famotidine  -     Magnesium  Right thyroid nodule      -      Stable on repeated US; continue to monitor with exam and TSH. Patient asymptomatic for thyroid issues.   Mixed hyperlipidemia Continue low cholesterol diet, red rice yeast supplement -     TSH -     Lipid panel  Vitamin D deficiency Continue supplementation -     VITAMIN D 25 Hydroxy (Vit-D Deficiency, Fractures)  Insomnia        -      Well managed by nightly trazodone       -      Diet/exercise, sleep hygiene discussed  Obesity Long discussion about weight loss, diet, and exercise Recommended diet heavy in fruits and veggies and low in animal meats, cheeses, and dairy products, appropriate calorie intake Discussed appropriate weight for height  Adding wellbutrin -  Follow up at next visit  Screening for diabetes mellitus -     Hemoglobin A1c  Screening for hematuria or proteinuria -     Urinalysis, Complete (81001)  Medication management -     CBC with Differential/Platelet -     CMP/GFR -     Urinalysis, Complete (81001)  Screening for cardiovascular condition -     EKG 12-Lead   Discussed med's effects and SE's. Screening labs and tests as requested with regular follow-up as recommended. Over 40 minutes of exam, counseling, chart review, and complex, high level critical decision making was performed this visit.   Future Appointments  Date Time Provider Department Center  12/09/2018  9:00 AM Judd Gaudier, NP [facility] None    HPI  47 y.o. Caucasian female, married, 2 kids, presents for a complete physical and follow up for has Mixed hyperlipidemia; Vitamin D deficiency; Right thyroid nodule; GERD (gastroesophageal reflux disease); Insomnia; and Overweight  (BMI 25.0-29.9) on their problem list.  She reports stress levels are significantly improved from last year; she is seeing a counselor every three to six weeks and is participating in a regular exercise program and weight watchers. She has a blended family, adult autistic son.   She was previously on wellbutrin for focus and mood, was off of medication for a while but interested in resuming this due to busy season and work focus. She continues with trazodone nightly which helps he fall back asleep and has significantly improved quality of life, but not as helpful for last 2 weeks and would like to increase dose. She reports headaches are down from monthly to every other month - resolve quickly with single tab of relpax. She sees GYN annually for PAPs and 3D mammogram. No issues/concerns today.    BMI is Body mass index is 30.79 kg/m., she has been working on diet and exercise. Joined a workout group and enjoying this 4 days a week. She is following a whole foods diet.  Wt Readings from Last 3 Encounters:  11/29/17 185 lb (83.9 kg)  11/29/16 172 lb (78 kg)  01/04/16 175 lb (79.4 kg)   Today their BP is BP: 108/68 She does workout. She denies chest pain, shortness of breath, dizziness.   She is not on cholesterol medication (on red yeast rice supplement) and denies myalgias. Her cholesterol is not at goal. The cholesterol last  visit was:   Lab Results  Component Value Date   CHOL 201 (H) 11/29/2016   HDL 70 11/29/2016   LDLCALC 111 (H) 11/29/2016   TRIG 101 11/29/2016   CHOLHDL 2.9 11/29/2016    Last A1C in the office was:  Lab Results  Component Value Date   HGBA1C 5.0 11/29/2016   Last GFR: Lab Results  Component Value Date   GFRNONAA 101 11/29/2016   Patient is on Vitamin D supplement and below goal at the last visit:    Lab Results  Component Value Date   VD25OH 37 11/29/2016      Current Medications:  Current Outpatient Medications on File Prior to Visit  Medication Sig  Dispense Refill  . Ascorbic Acid (VITAMIN C) 1000 MG tablet Take 1,000 mg by mouth daily.    Marland Kitchen. aspirin 81 MG tablet Take 81 mg by mouth daily.    . calcium carbonate (OS-CAL) 600 MG TABS tablet Take 600 mg by mouth 2 (two) times daily with a meal.    . eletriptan (RELPAX) 40 MG tablet TAKE 1 TABLET BY MOUTH AS NEEDED FOR MIGRAINE OR HEADACHE MAY REPEAT IN 2 HOURS IF NEEDED 10 tablet 5  . fluticasone (FLONASE) 50 MCG/ACT nasal spray Place into both nostrils daily.    . Magnesium 250 MG TABS Take by mouth.    . meclizine (ANTIVERT) 25 MG tablet Take 1 tablet (25 mg total) by mouth 3 (three) times daily as needed for dizziness. 30 tablet 1  . montelukast (SINGULAIR) 10 MG tablet Take 1 tablet (10 mg total) by mouth daily. 30 tablet 2  . Multiple Vitamins-Minerals (MULTIVITAMIN WITH MINERALS) tablet Take 1 tablet by mouth. Three times per week    . OVER THE COUNTER MEDICATION 1 tablet daily. [employer] brand allergy pill    . Red Yeast Rice 600 MG CAPS Take 600 mg by mouth 2 (two) times daily.    . traZODone (DESYREL) 100 MG tablet TAKE 1 TABLET BY MOUTH EVERYDAY AT BEDTIME 90 tablet 1  . Vitamin D, Cholecalciferol, 1000 UNITS TABS Take 1,000 Units by mouth. Three times per week    . Zinc 50 MG TABS Take by mouth daily.    Marland Kitchen. azelastine (ASTELIN) 0.1 % nasal spray Place 2 sprays into both nostrils 2 (two) times daily. Use in each nostril as directed (Patient not taking: Reported on 11/29/2016) 30 mL 2   No current facility-administered medications on file prior to visit.    Allergies:  Allergies  Allergen Reactions  . Tetracyclines & Related     Hives   Medical History:  She has Mixed hyperlipidemia; Vitamin D deficiency; Right thyroid nodule; GERD (gastroesophageal reflux disease); Insomnia; and Overweight (BMI 25.0-29.9) on their problem list. Health Maintenance:   Immunization History  Administered Date(s) Administered  . Influenza-Unspecified 11/23/2016  . PPD Test 11/20/2013  . Td  01/17/2004  . Tdap 07/17/2013   Tetanus: 2015 Flu vaccine: 2019 Zostavax: n/a LMP: No LMP recorded. (Menstrual status: IUD). Pap: 10/2017 Sees GYN annually  MGM: 10/2017 - 3D performed in Dr. Carmin RichmondLow's office   Colonoscopy: n/a EGD: 2016  Last Dental Exam: goes q 2329m, last 2019, Dr. Esau Grewammy Lee Last Eye Exam: Annually - Dr. Sharlot GowdaKoop - [facility] - no issues Last Skin Exam: Annually with Dr. Emily FilbertGould   Patient Care Team: Lucky CowboyMcKeown, William, MD as PCP - General (Internal Medicine) Candice CampLowe, David, MD as Consulting Physician (Obstetrics and Gynecology) Amelia JoLewit, Eliot, MD as Consulting Physician (Neurology) Elmon ElseGould, Karen,  MD as Consulting Physician (Dermatology)  Surgical History:  She has a past surgical history that includes Gynecologic cryosurgery and Mole removal. Family History:  Herfamily history includes Cancer in her maternal grandfather; Colon polyps in her mother; Dementia in her maternal grandmother; Diabetes in her other; Heart disease in her maternal grandfather; Hyperlipidemia in her mother; Hypertension in her mother; Hypothyroidism in her father and mother; Irritable bowel syndrome in her mother; Osteopenia in her paternal grandmother; Stroke in her paternal grandfather. Social History:  She reports that she has never smoked. She has never used smokeless tobacco. She reports that she drinks alcohol. She reports that she does not use drugs.  Review of Systems: Review of Systems  Constitutional: Negative for malaise/fatigue and weight loss.  HENT: Negative for hearing loss and tinnitus.   Eyes: Negative for blurred vision and double vision.  Respiratory: Negative for cough, shortness of breath and wheezing.   Cardiovascular: Negative for chest pain, palpitations, orthopnea, claudication and leg swelling.  Gastrointestinal: Negative for abdominal pain, blood in stool, constipation, diarrhea, heartburn, melena, nausea and vomiting.  Genitourinary: Negative.   Musculoskeletal: Negative  for joint pain and myalgias.  Skin: Negative for rash.  Neurological: Negative for dizziness, tingling, sensory change, weakness and headaches.  Endo/Heme/Allergies: Positive for environmental allergies. Negative for polydipsia.  Psychiatric/Behavioral: Negative for depression, substance abuse and suicidal ideas. The patient has insomnia. The patient is not nervous/anxious.   All other systems reviewed and are negative.   Physical Exam: Estimated body mass index is 30.79 kg/m as calculated from the following:   Height as of this encounter: 5\' 5"  (1.651 m).   Weight as of this encounter: 185 lb (83.9 kg). BP 108/68   Pulse 73   Temp 97.7 F (36.5 C)   Ht 5\' 5"  (1.651 m)   Wt 185 lb (83.9 kg)   SpO2 97%   BMI 30.79 kg/m  General Appearance: Well nourished, in no apparent distress.  Eyes: PERRLA, EOMs, conjunctiva no swelling or erythema, normal fundi and vessels.  Sinuses: No Frontal/maxillary tenderness  ENT/Mouth: Ext aud canals clear, normal light reflex with TMs without erythema, bulging. Good dentition. No erythema, swelling, or exudate on post pharynx. Tonsils not swollen or erythematous. Hearing normal.  Neck: Supple, thyroid normal. No bruits  Respiratory: Respiratory effort normal, BS equal bilaterally without rales, rhonchi, wheezing or stridor.  Cardio: RRR without murmurs, rubs or gallops. Brisk peripheral pulses without edema.  Chest: symmetric, with normal excursions and percussion.  Breasts: Defer to GYN Abdomen: Soft, nontender, no guarding, rebound, hernias, masses, or organomegaly.  Lymphatics: Non tender without lymphadenopathy.  Genitourinary: Defer to GYN Musculoskeletal: Full ROM all peripheral extremities,5/5 strength, and normal gait.  Skin: Warm, dry without rashes, lesions, ecchymosis. Neuro: Cranial nerves intact, reflexes equal bilaterally. Normal muscle tone, no cerebellar symptoms. Sensation intact.  Psych: Awake and oriented X 3, normal affect,  Insight and Judgment appropriate.   EKG: Sinus brady -WNL- no ST changes.    Carlyon Shadow Corbett 9:12 AM [facility]

## 2017-11-29 ENCOUNTER — Encounter: Payer: Self-pay | Admitting: Adult Health

## 2017-11-29 ENCOUNTER — Ambulatory Visit (INDEPENDENT_AMBULATORY_CARE_PROVIDER_SITE_OTHER): Payer: BLUE CROSS/BLUE SHIELD | Admitting: Adult Health

## 2017-11-29 VITALS — BP 108/68 | HR 73 | Temp 97.7°F | Ht 65.0 in | Wt 185.0 lb

## 2017-11-29 DIAGNOSIS — E559 Vitamin D deficiency, unspecified: Secondary | ICD-10-CM

## 2017-11-29 DIAGNOSIS — K219 Gastro-esophageal reflux disease without esophagitis: Secondary | ICD-10-CM

## 2017-11-29 DIAGNOSIS — G4700 Insomnia, unspecified: Secondary | ICD-10-CM

## 2017-11-29 DIAGNOSIS — Z136 Encounter for screening for cardiovascular disorders: Secondary | ICD-10-CM

## 2017-11-29 DIAGNOSIS — I10 Essential (primary) hypertension: Secondary | ICD-10-CM | POA: Diagnosis not present

## 2017-11-29 DIAGNOSIS — Z0000 Encounter for general adult medical examination without abnormal findings: Secondary | ICD-10-CM | POA: Diagnosis not present

## 2017-11-29 DIAGNOSIS — E66811 Obesity, class 1: Secondary | ICD-10-CM

## 2017-11-29 DIAGNOSIS — Z131 Encounter for screening for diabetes mellitus: Secondary | ICD-10-CM

## 2017-11-29 DIAGNOSIS — Z1389 Encounter for screening for other disorder: Secondary | ICD-10-CM | POA: Diagnosis not present

## 2017-11-29 DIAGNOSIS — F331 Major depressive disorder, recurrent, moderate: Secondary | ICD-10-CM

## 2017-11-29 DIAGNOSIS — Z79899 Other long term (current) drug therapy: Secondary | ICD-10-CM | POA: Diagnosis not present

## 2017-11-29 DIAGNOSIS — Z1329 Encounter for screening for other suspected endocrine disorder: Secondary | ICD-10-CM | POA: Diagnosis not present

## 2017-11-29 DIAGNOSIS — Z13220 Encounter for screening for lipoid disorders: Secondary | ICD-10-CM | POA: Diagnosis not present

## 2017-11-29 DIAGNOSIS — E669 Obesity, unspecified: Secondary | ICD-10-CM

## 2017-11-29 DIAGNOSIS — Z0001 Encounter for general adult medical examination with abnormal findings: Secondary | ICD-10-CM

## 2017-11-29 DIAGNOSIS — E782 Mixed hyperlipidemia: Secondary | ICD-10-CM

## 2017-11-29 DIAGNOSIS — E041 Nontoxic single thyroid nodule: Secondary | ICD-10-CM

## 2017-11-29 MED ORDER — TRAZODONE HCL 150 MG PO TABS: 150.0000 mg | ORAL_TABLET | Freq: Every day | ORAL | 1 refills | Status: DC

## 2017-11-29 MED ORDER — BUPROPION HCL ER (XL) 150 MG PO TB24: 150.0000 mg | ORAL_TABLET | ORAL | 2 refills | Status: DC

## 2017-11-29 NOTE — Patient Instructions (Addendum)
Shawna Forbes , Thank you for taking time to come for your Annual  Wellness Visit. I appreciate your ongoing commitment to your health goals. Please review the following plan we discussed and let me know if I can assist you in the future.   These are the goals we discussed: Goals    . LDL CALC < 100    . Weight (lb) < 175 lb (79.4 kg)       This is a list of the screening recommended for you and due dates:  Health Maintenance  Topic Date Due  . HIV Screening  12/26/2019*  . Pap Smear  10/16/2020  . Tetanus Vaccine  07/18/2023  . Flu Shot  Completed  *Topic was postponed. The date shown is not the original due date.    Know what a healthy weight is for you (roughly BMI <25) and aim to maintain this  Aim for 7+ servings of fruits and vegetables daily  65-80+ fluid ounces of water or unsweet tea for healthy kidneys  Limit to max 1 drink of alcohol per day; avoid smoking/tobacco  Limit animal fats in diet for cholesterol and heart health - choose grass fed whenever available  Avoid highly processed foods, and foods high in saturated/trans fats  Aim for low stress - take time to unwind and care for your mental health  Aim for 150 min of moderate intensity exercise weekly for heart health, and weights twice weekly for bone health  Aim for 7-9 hours of sleep daily      Drink 1/2 your body weight in fluid ounces of water daily; drink a tall glass of water 30 min before meals  Don't eat until you're stuffed- listen to your stomach and eat until you are 80% full   Try eating off of a salad plate; wait 10 min after finishing before going back for seconds  Start by eating the vegetables on your plate; aim for 16% of your meals to be fruits or vegetables  Then eat your protein - lean meats (grass fed if possible), fish, beans, nuts in moderation  Eat your carbs/starch last ONLY if you still are hungry. If you can, stop before finishing it all  Avoid sugar and flour -  the closer it looks to it's original form in nature, typically the better it is for you  Splurge in moderation - "assign" days when you get to splurge and have the "bad stuff" - I like to follow a 80% - 20% plan- "good" choices 80 % of the time, "bad" choices in moderation 20% of the time  Simple equation is: Calories out > calories in = weight loss - even if you eat the bad stuff, if you limit portions, you will still lose weight     8 Critical Weight-Loss Tips That Aren't Diet and Exercise  1. STARVE THE DISTRACTIONS  All too often when we eat, we're also multitasking: watching TV, answering emails, scrolling through social media. These habits are detrimental to having a strong, clear, healthy relationship with food, and they can hinder our ability to make dietary changes.  In order to truly focus on what you're eating, how much you're eating, why you're eating those specific foods and, most importantly, how those foods make you feel, you need to starve the distractions. That means when you eat, just eat. Focus on your food, the process it went through to end up on your plate, where it came from and how it nourishes you. With this  technique, you're more likely to finish a meal feeling satiated.  2.  CONSIDER WHAT YOU'RE NOT WILLING TO DO  This might sound counterintuitive, but it can help provide a "why" when motivation is waning. Declare, in writing, what you are unwilling to do, for example "I am unwilling to be the old dad who cannot play sports with my children".  So consider what you're not willing to accept, write it down, and keep it at the ready.  3.  STOP LABELING FOOD "GOOD" AND "BAD"  You've probably heard someone say they ate something "bad." Maybe you've even said it yourself.  The trouble with 'bad' foods isn't that they'll send you to the grave after a bite or two. The trouble comes when we eat excessive portions of really calorie-dense foods meal after meal, day after  day.  Instead of labeling foods as good or bad, think about which foods you can eat a lot of, and which ones you should just eat a little of. Then, plan ways to eat the foods you really like in portions that fit with your overall goals. A good example of this would be having a slice of pizza alongside a club salad with chicken breast, avocado and a bit of dressing. This is vastly different than 3 slices of pizza, 4 breadsticks with cheese sauce and half of a liter of regular soda.  4.  BRUSH YOUR TEETH AFTER YOU EAT  Getting your mindset in order is important, but sometimes small habits can make a big difference. After eating, you still have the taste of food in their mouth, which often causes people to eat more even if they are full or engage in a nibble or two of dessert.  Brushing your teeth will remove the taste of food from your mouth, and the clean, minty freshness will serve as a cue that mealtime is over.  5.  FOCUS ON CROWDING NOT CUTTING  The most common first step during 'dieting' is to cut. We cut our portion sizes down, we cut out 'bad' foods, we cut out entire food groups. This act of cutting puts us and our minds into scarcity mode.  When something is off-limits, even if you're able to avoid it for a while, you could end up bingeing on it later because you've gone so long without it. So, instead of cutting, focus on crowding. If you crowd your plate and fill it up with more foods like veggies and protein, it simply allows less room for the other stuff. In other words, shift your focus away from what you can't eat, and celebrate the foods that will help you reach your goals.  6.  TAKE TRACKING A STEP FURTHER  Track what you eat, when you ate it, how much you ate and how that food made you feel. Being completely honest with yourself and writing down every single thing that passes through your lips will help you start to notice that maybe you actually do snack, possibly take in more sugar  than you thought, eat when you're bored rather than just hungry or maybe that you have a habit of snacking before bed while watching TV.  The difference from simply tracking your food intake is you're taking into account how food makes you feel, as well as what you're doing while you're eating. This is about becoming more mindful of what, when and why you eat.  7.  PRIORITIZE GOOD SLEEP  One of the strongest risk factors for being overweight  is poor sleep. When you're feeling tired, you're more likely to choose unhealthy comfort foods and to skip your workout. Additionally, sleep deprivation may slow down your metabolism. Burnett Kanaris! Therefore, sleeping 7-8 hours per night can help with weight loss without having to change your diet or increase your physical activity. And if you feel you snore and still wake up tired, talk with me about sleep apnea.  8.  SET ASIDE TIME TO DISCONNECT  Just get out there. Disconnect from the electronics and connect to the elements. Not only will this help reduce stress (a major factor in weight gain) by giving your mind a break from the constant stimulation we've all become so accustomed to, but it may also reprogram your brain to connect with yourself and what you're feeling.

## 2017-11-30 LAB — URINALYSIS W MICROSCOPIC + REFLEX CULTURE
Bilirubin Urine: NEGATIVE
Glucose, UA: NEGATIVE
Hgb urine dipstick: NEGATIVE
Hyaline Cast: NONE SEEN /LPF
Ketones, ur: NEGATIVE
Leukocyte Esterase: NEGATIVE
Nitrites, Initial: NEGATIVE
Protein, ur: NEGATIVE
Specific Gravity, Urine: 1.022 (ref 1.001–1.03)
WBC, UA: NONE SEEN /HPF (ref 0–5)
pH: 7 (ref 5.0–8.0)

## 2017-11-30 LAB — LIPID PANEL
Cholesterol: 221 mg/dL — ABNORMAL HIGH (ref <200)
HDL: 81 mg/dL (ref >50)
LDL Cholesterol (Calc): 118 mg/dL (calc) — ABNORMAL HIGH
Non-HDL Cholesterol (Calc): 140 mg/dL (calc) — ABNORMAL HIGH (ref <130)
Total CHOL/HDL Ratio: 2.7 (calc) (ref <5.0)
Triglycerides: 113 mg/dL (ref <150)

## 2017-11-30 LAB — COMPLETE METABOLIC PANEL WITH GFR
AG Ratio: 1.3 (calc) (ref 1.0–2.5)
ALT: 26 U/L (ref 6–29)
AST: 26 U/L (ref 10–35)
Albumin: 4.5 g/dL (ref 3.6–5.1)
Alkaline phosphatase (APISO): 41 U/L (ref 33–115)
BUN: 15 mg/dL (ref 7–25)
CO2: 30 mmol/L (ref 20–32)
Calcium: 10.4 mg/dL — ABNORMAL HIGH (ref 8.6–10.2)
Chloride: 101 mmol/L (ref 98–110)
Creat: 0.89 mg/dL (ref 0.50–1.10)
GFR, Est African American: 90 mL/min/{1.73_m2} (ref >=60)
GFR, Est Non African American: 78 mL/min/{1.73_m2} (ref >=60)
Globulin: 3.5 g/dL (calc) (ref 1.9–3.7)
Glucose, Bld: 87 mg/dL (ref 65–99)
Potassium: 4 mmol/L (ref 3.5–5.3)
Sodium: 138 mmol/L (ref 135–146)
Total Bilirubin: 0.6 mg/dL (ref 0.2–1.2)
Total Protein: 8 g/dL (ref 6.1–8.1)

## 2017-11-30 LAB — CBC WITH DIFFERENTIAL/PLATELET
Basophils Absolute: 38 cells/uL (ref 0–200)
Basophils Relative: 0.5 %
Eosinophils Absolute: 128 cells/uL (ref 15–500)
Eosinophils Relative: 1.7 %
HCT: 43.1 % (ref 35.0–45.0)
Hemoglobin: 14.4 g/dL (ref 11.7–15.5)
Lymphs Abs: 2310 cells/uL (ref 850–3900)
MCH: 29.4 pg (ref 27.0–33.0)
MCHC: 33.4 g/dL (ref 32.0–36.0)
MCV: 88.1 fL (ref 80.0–100.0)
MPV: 11.8 fL (ref 7.5–12.5)
Monocytes Relative: 9.1 %
Neutro Abs: 4343 cells/uL (ref 1500–7800)
Neutrophils Relative %: 57.9 %
Platelets: 270 10*3/uL (ref 140–400)
RBC: 4.89 10*6/uL (ref 3.80–5.10)
RDW: 12.1 % (ref 11.0–15.0)
Total Lymphocyte: 30.8 %
WBC mixed population: 683 cells/uL (ref 200–950)
WBC: 7.5 10*3/uL (ref 3.8–10.8)

## 2017-11-30 LAB — HEMOGLOBIN A1C
Hgb A1c MFr Bld: 5.1 % of total Hgb (ref <5.7)
Mean Plasma Glucose: 100 (calc)
eAG (mmol/L): 5.5 (calc)

## 2017-11-30 LAB — MICROALBUMIN / CREATININE URINE RATIO
Creatinine, Urine: 129 mg/dL (ref 20–275)
Microalb Creat Ratio: 3 mcg/mg creat (ref <30)
Microalb, Ur: 0.4 mg/dL

## 2017-11-30 LAB — VITAMIN D 25 HYDROXY (VIT D DEFICIENCY, FRACTURES): Vit D, 25-Hydroxy: 37 ng/mL (ref 30–100)

## 2017-11-30 LAB — NO CULTURE INDICATED

## 2017-11-30 LAB — TSH: TSH: 0.22 mIU/L — ABNORMAL LOW

## 2017-11-30 LAB — MAGNESIUM: Magnesium: 2 mg/dL (ref 1.5–2.5)

## 2017-12-01 ENCOUNTER — Encounter: Payer: Self-pay | Admitting: Adult Health

## 2017-12-01 DIAGNOSIS — E0590 Thyrotoxicosis, unspecified without thyrotoxic crisis or storm: Secondary | ICD-10-CM

## 2017-12-01 HISTORY — DX: Thyrotoxicosis, unspecified without thyrotoxic crisis or storm: E05.90

## 2017-12-21 ENCOUNTER — Other Ambulatory Visit: Payer: Self-pay | Admitting: Adult Health

## 2017-12-21 DIAGNOSIS — E0590 Thyrotoxicosis, unspecified without thyrotoxic crisis or storm: Secondary | ICD-10-CM

## 2017-12-21 DIAGNOSIS — E041 Nontoxic single thyroid nodule: Secondary | ICD-10-CM

## 2017-12-21 NOTE — Progress Notes (Signed)
Patient with hx of thyroid nodule, biopsy neg in 07/2012, follow up US in 08/2015 demonstrated multinodular thyroid, no nodules meeting criteria for further workup and recommended no further workup at that time. Patient does have family history of hypothyroid in her mother and father. TSHs have been monitored in this office, slowly trending down, most recent mildly hypothyroid at 0.22 on 11/29/17 without symptoms of hyperthyroid. Patient previously established with Dr. Elvera LennoxGherghe for endocrinology. We discussed follow up labs through our office, patient prefers referral to endocrinology, requesting specifically to Dr. Adrian PrinceStephen South per family recommendation, referral entered today.

## 2017-12-25 DIAGNOSIS — F321 Major depressive disorder, single episode, moderate: Secondary | ICD-10-CM | POA: Diagnosis not present

## 2018-01-13 IMAGING — US US SOFT TISSUE HEAD/NECK
1 series · 13 of 25 positions shown · non-contrast
Comparison: 07/22/2012, biopsy 08/06/2012

CLINICAL DATA: 44-year-old female with a history of thyroid nodule.
Right inferior thyroid nodule biopsied 08/06/2012

EXAM:
THYROID ULTRASOUND
TECHNIQUE: Ultrasound examination of the thyroid gland and adjacent soft
tissues was performed.

[Series 1: us soft tissue head/neck · 0.07mm/px · 13 of 54 slices shown]
[im 1/54]
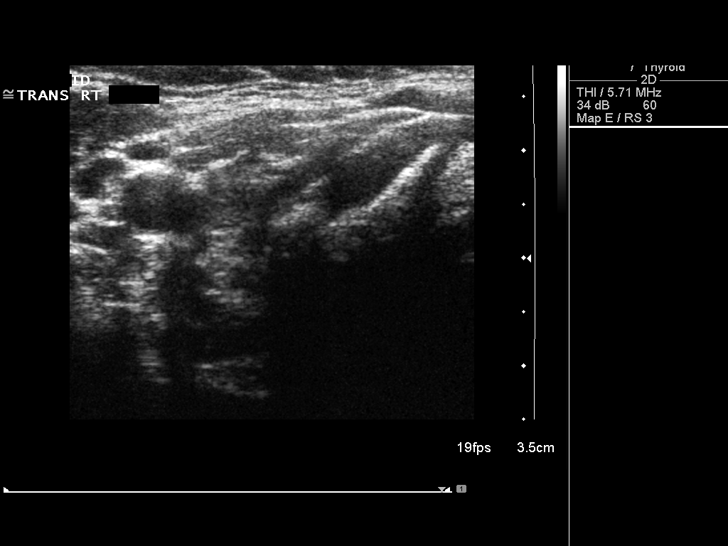
[im 5/54]
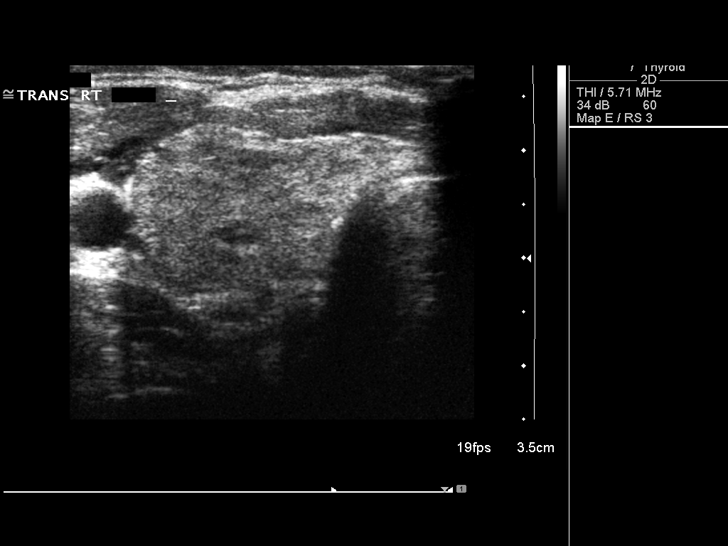
[im 9/54]
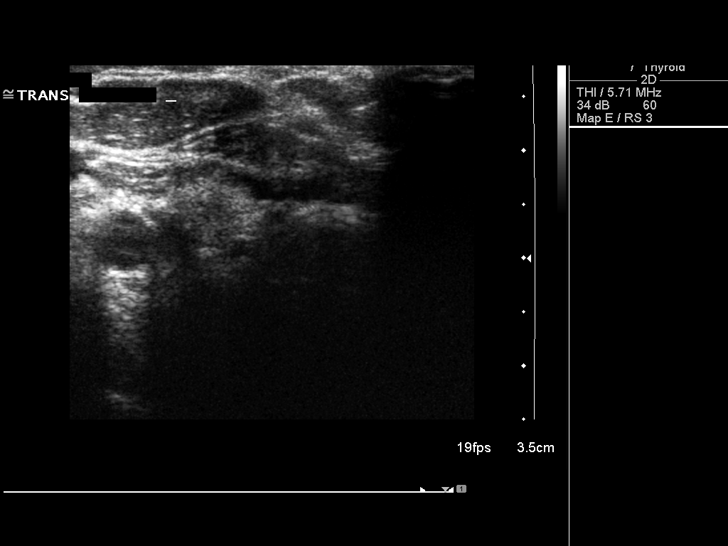
[im 14/54]
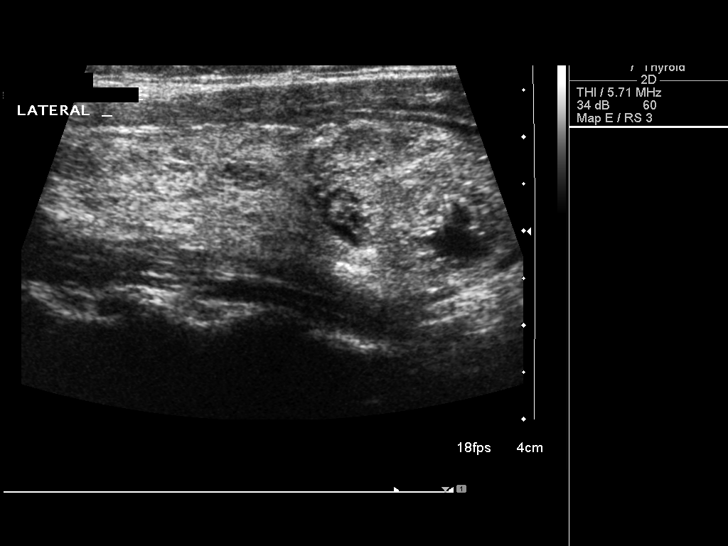
[im 18/54]
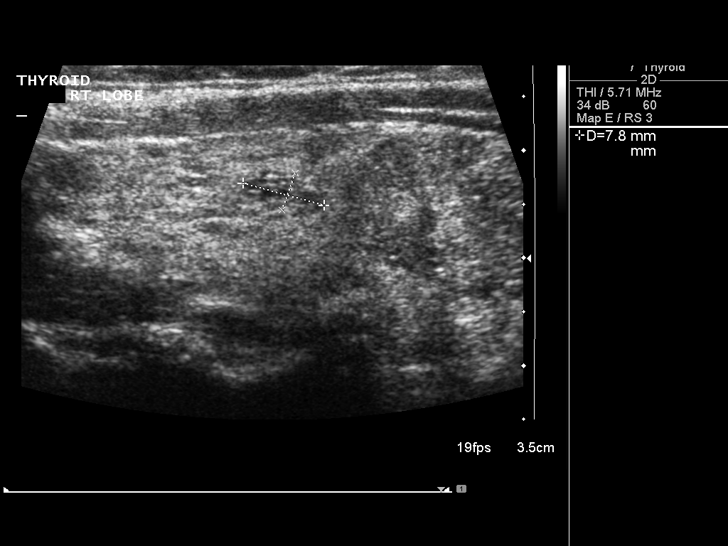
[im 23/54]
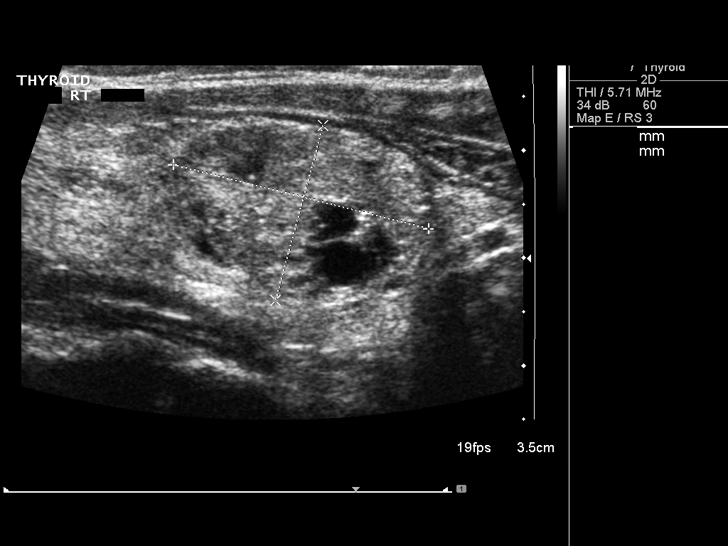
[im 27/54]
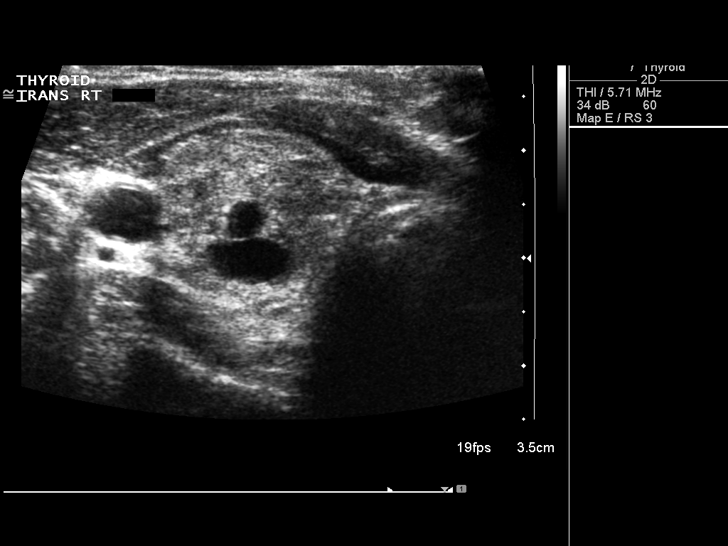
[im 31/54]
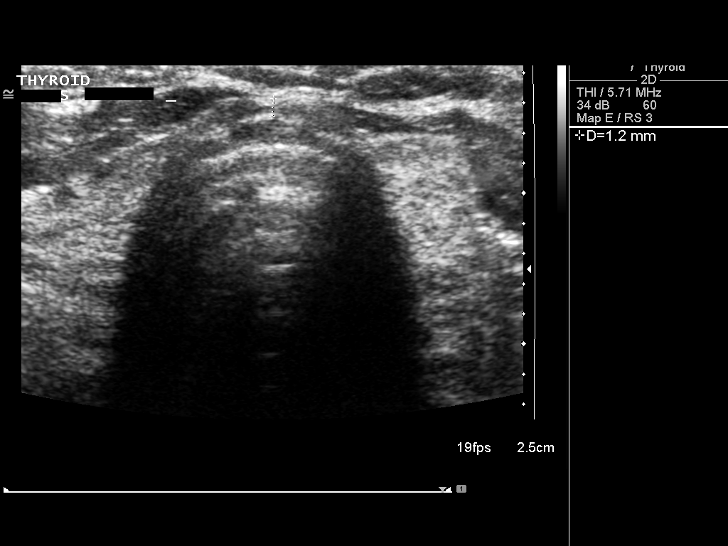
[im 36/54]
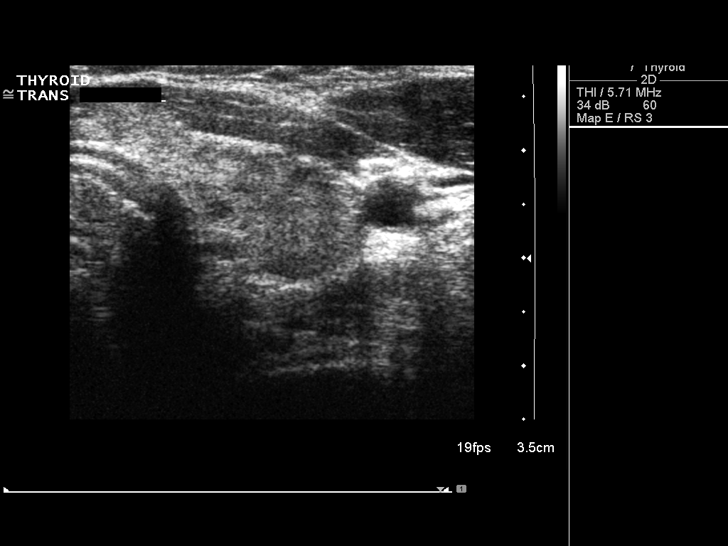
[im 40/54]
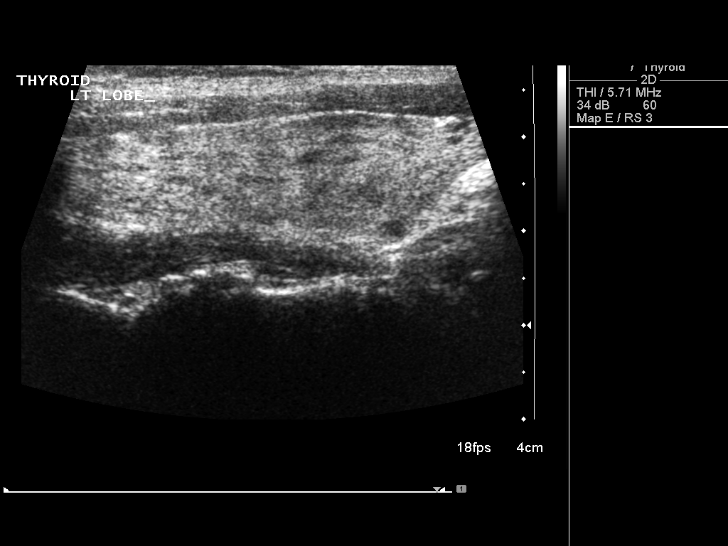
[im 45/54]
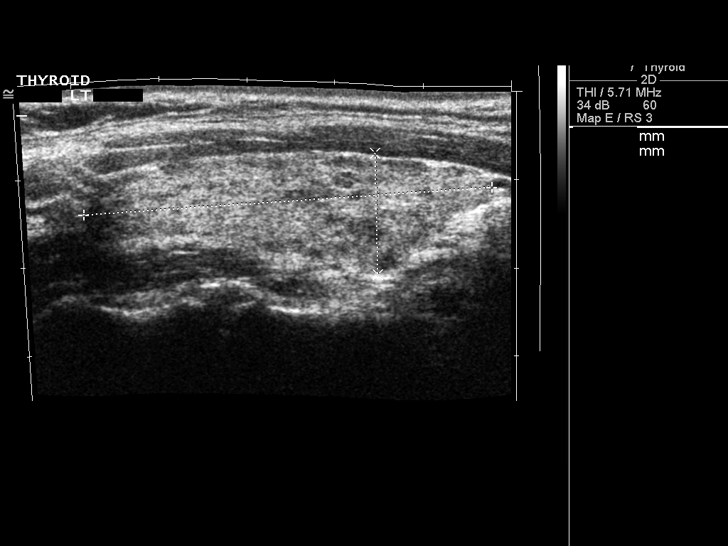
[im 49/54]
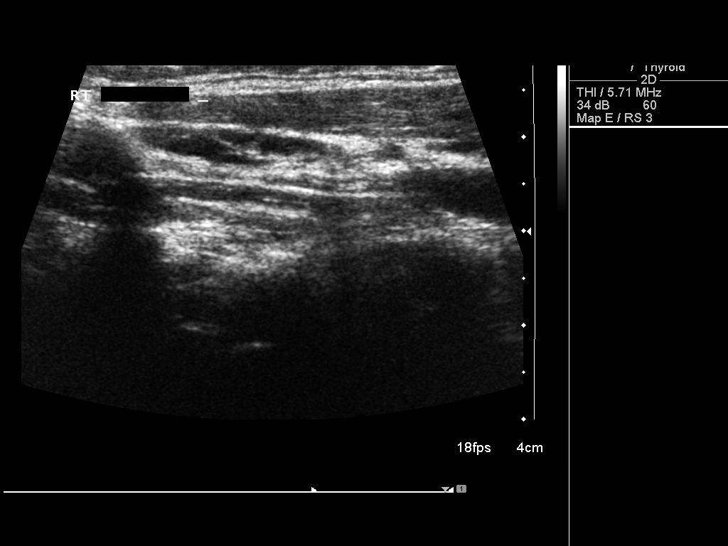
[im 54/54]
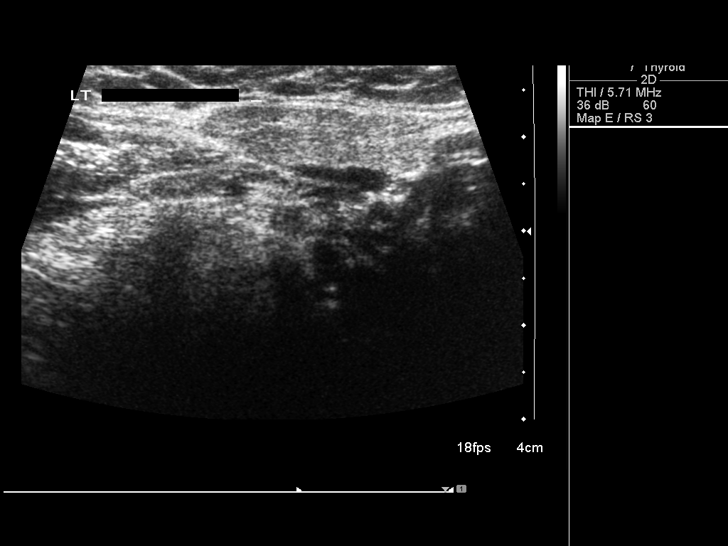

[13 of 25 positions shown; findings below may reference images not displayed]

FINDINGS: Right thyroid lobe

Measurements: 5.6 cm x 2.1 cm x 2.2 cm. Heterogeneous appearance of
right-sided thyroid tissue.

Right inferior nodule #1 measures 0.8 cm x 0.4 cm x 0.5 cm
spongiform composition (0), with isoechoic echogenicity (1), wider
than tall shape (0), ill-defined margin (0), with no internal
calcifications (0). Configuration compatible with spongiform nodule.
Total ACR points 1 for TI-RADS level of TR 1.

Right inferior nodule #2 measures 2.4 cm x 1.7 cm x 2.0 cm mixed
cystic and solid composition (1), with isoechoic echogenicity (1),
wider than tall shape (0), smooth margin (0), with no internal
calcifications (0). Total ACR points 2 for TI-RADS level of TR 2.

Left thyroid lobe

Measurements: 4.6 cm x 1.4 cm x 1.6 cm.  No nodules visualized.

Isthmus

Thickness: 0.1 cm.  No nodules visualized.

Lymphadenopathy

None visualized.
IMPRESSION: Multinodular thyroid again demonstrated. The right inferior nodule
has been biopsied previously. Recommend correlation with prior
biopsy results.

Features of these nodules do not meet criteria for surveillance, as
designated by the newly established ACR TI-RADS criteria. Presuming
benign biopsy on the prior sample, surveillance not recommended by
the updated guidelines.

Recommendations follow those established by the new ACR TI-RADS
criteria ([HOSPITAL] 8890;[DATE]).

## 2018-01-24 DIAGNOSIS — R946 Abnormal results of thyroid function studies: Secondary | ICD-10-CM | POA: Diagnosis not present

## 2018-01-24 DIAGNOSIS — E041 Nontoxic single thyroid nodule: Secondary | ICD-10-CM | POA: Diagnosis not present

## 2018-01-24 DIAGNOSIS — E668 Other obesity: Secondary | ICD-10-CM | POA: Diagnosis not present

## 2018-01-25 ENCOUNTER — Other Ambulatory Visit: Payer: Self-pay | Admitting: Endocrinology

## 2018-01-25 DIAGNOSIS — E041 Nontoxic single thyroid nodule: Secondary | ICD-10-CM

## 2018-01-29 ENCOUNTER — Ambulatory Visit
Admission: RE | Admit: 2018-01-29 | Discharge: 2018-01-29 | Disposition: A | Discharge status: Home / Self Care | Payer: 59 | Source: Ambulatory Visit | Attending: Endocrinology | Admitting: Endocrinology

## 2018-01-29 DIAGNOSIS — E041 Nontoxic single thyroid nodule: Secondary | ICD-10-CM

## 2018-02-01 ENCOUNTER — Other Ambulatory Visit: Payer: Self-pay | Admitting: Endocrinology

## 2018-02-01 ENCOUNTER — Other Ambulatory Visit (HOSPITAL_COMMUNITY): Payer: Self-pay | Admitting: Endocrinology

## 2018-02-01 DIAGNOSIS — R946 Abnormal results of thyroid function studies: Secondary | ICD-10-CM

## 2018-02-11 ENCOUNTER — Ambulatory Visit (HOSPITAL_COMMUNITY): Payer: BLUE CROSS/BLUE SHIELD

## 2018-02-11 ENCOUNTER — Other Ambulatory Visit (HOSPITAL_COMMUNITY): Payer: BLUE CROSS/BLUE SHIELD

## 2018-02-12 ENCOUNTER — Other Ambulatory Visit (HOSPITAL_COMMUNITY): Payer: BLUE CROSS/BLUE SHIELD

## 2018-02-18 ENCOUNTER — Encounter (HOSPITAL_COMMUNITY)
Admission: RE | Admit: 2018-02-18 | Discharge: 2018-02-18 | Disposition: A | Discharge status: Home / Self Care | Payer: BLUE CROSS/BLUE SHIELD | Source: Ambulatory Visit | Attending: Endocrinology | Admitting: Endocrinology

## 2018-02-18 DIAGNOSIS — R946 Abnormal results of thyroid function studies: Secondary | ICD-10-CM | POA: Diagnosis not present

## 2018-02-18 MED ORDER — SODIUM IODIDE I 131 CAPSULE: 440.0000 | Freq: Once | INTRAVENOUS | Status: DC | PRN

## 2018-02-18 MED ORDER — SODIUM IODIDE I-123 7.4 MBQ CAPS
440.0000 | ORAL_CAPSULE | Freq: Once | ORAL | Status: AC
  Administered 2018-02-18: 440 via ORAL

## 2018-02-19 ENCOUNTER — Encounter (HOSPITAL_COMMUNITY)
Admission: RE | Admit: 2018-02-19 | Discharge: 2018-02-19 | Disposition: A | Discharge status: Home / Self Care | Payer: BLUE CROSS/BLUE SHIELD | Source: Ambulatory Visit | Attending: Endocrinology | Admitting: Endocrinology

## 2018-02-19 DIAGNOSIS — R946 Abnormal results of thyroid function studies: Secondary | ICD-10-CM | POA: Diagnosis not present

## 2018-02-26 DIAGNOSIS — F321 Major depressive disorder, single episode, moderate: Secondary | ICD-10-CM | POA: Diagnosis not present

## 2018-03-05 ENCOUNTER — Ambulatory Visit: Payer: BLUE CROSS/BLUE SHIELD

## 2018-03-05 ENCOUNTER — Ambulatory Visit
Admission: RE | Admit: 2018-03-05 | Payer: BLUE CROSS/BLUE SHIELD | Source: Ambulatory Visit | Admitting: Radiation Oncology

## 2018-03-06 DIAGNOSIS — E041 Nontoxic single thyroid nodule: Secondary | ICD-10-CM | POA: Diagnosis not present

## 2018-03-06 DIAGNOSIS — R946 Abnormal results of thyroid function studies: Secondary | ICD-10-CM | POA: Diagnosis not present

## 2018-03-07 DIAGNOSIS — R946 Abnormal results of thyroid function studies: Secondary | ICD-10-CM | POA: Diagnosis not present

## 2018-03-07 DIAGNOSIS — E041 Nontoxic single thyroid nodule: Secondary | ICD-10-CM | POA: Diagnosis not present

## 2018-03-26 DIAGNOSIS — F321 Major depressive disorder, single episode, moderate: Secondary | ICD-10-CM | POA: Diagnosis not present

## 2018-03-28 DIAGNOSIS — E0510 Thyrotoxicosis with toxic single thyroid nodule without thyrotoxic crisis or storm: Secondary | ICD-10-CM | POA: Diagnosis not present

## 2018-04-30 DIAGNOSIS — F321 Major depressive disorder, single episode, moderate: Secondary | ICD-10-CM | POA: Diagnosis not present

## 2018-05-28 DIAGNOSIS — F321 Major depressive disorder, single episode, moderate: Secondary | ICD-10-CM | POA: Diagnosis not present

## 2018-06-05 ENCOUNTER — Other Ambulatory Visit: Payer: Self-pay | Admitting: Adult Health

## 2018-06-05 DIAGNOSIS — F331 Major depressive disorder, recurrent, moderate: Secondary | ICD-10-CM

## 2018-06-14 DIAGNOSIS — E041 Nontoxic single thyroid nodule: Secondary | ICD-10-CM | POA: Diagnosis not present

## 2018-06-14 DIAGNOSIS — E669 Obesity, unspecified: Secondary | ICD-10-CM | POA: Diagnosis not present

## 2018-06-14 DIAGNOSIS — E0520 Thyrotoxicosis with toxic multinodular goiter without thyrotoxic crisis or storm: Secondary | ICD-10-CM | POA: Diagnosis not present

## 2018-06-25 DIAGNOSIS — F321 Major depressive disorder, single episode, moderate: Secondary | ICD-10-CM | POA: Diagnosis not present

## 2018-07-02 DIAGNOSIS — Z30433 Encounter for removal and reinsertion of intrauterine contraceptive device: Secondary | ICD-10-CM | POA: Diagnosis not present

## 2018-08-13 DIAGNOSIS — F321 Major depressive disorder, single episode, moderate: Secondary | ICD-10-CM | POA: Diagnosis not present

## 2018-08-13 DIAGNOSIS — Z30431 Encounter for routine checking of intrauterine contraceptive device: Secondary | ICD-10-CM | POA: Diagnosis not present

## 2018-09-10 DIAGNOSIS — F321 Major depressive disorder, single episode, moderate: Secondary | ICD-10-CM | POA: Diagnosis not present

## 2018-09-20 DIAGNOSIS — E890 Postprocedural hypothyroidism: Secondary | ICD-10-CM | POA: Diagnosis not present

## 2018-09-20 DIAGNOSIS — E669 Obesity, unspecified: Secondary | ICD-10-CM | POA: Diagnosis not present

## 2018-09-24 DIAGNOSIS — E0520 Thyrotoxicosis with toxic multinodular goiter without thyrotoxic crisis or storm: Secondary | ICD-10-CM | POA: Diagnosis not present

## 2018-10-08 DIAGNOSIS — F321 Major depressive disorder, single episode, moderate: Secondary | ICD-10-CM | POA: Diagnosis not present

## 2018-11-05 DIAGNOSIS — F321 Major depressive disorder, single episode, moderate: Secondary | ICD-10-CM | POA: Diagnosis not present

## 2018-11-22 ENCOUNTER — Other Ambulatory Visit: Payer: Self-pay | Admitting: Adult Health

## 2018-11-22 DIAGNOSIS — F331 Major depressive disorder, recurrent, moderate: Secondary | ICD-10-CM

## 2018-12-03 DIAGNOSIS — F321 Major depressive disorder, single episode, moderate: Secondary | ICD-10-CM | POA: Diagnosis not present

## 2018-12-05 ENCOUNTER — Encounter: Payer: Self-pay | Admitting: Adult Health

## 2018-12-05 NOTE — Progress Notes (Signed)
Complete Physical  Assessment and Plan:  Diagnoses and all orders for this visit:  Encounter for routine adult health examination with abnormal findings  Gastroesophageal reflux disease without esophagitis Well managed by lifestyle; uses famotidine  -     Magnesium  Right thyroid nodule/ thyroid S/p ablation followed by endocrinology  Check TSH Synthroid managed by endocrine Will request reports  Mixed hyperlipidemia Continue low cholesterol diet, red rice yeast supplement Initiate medication if LDL trending 130+  -     TSH -     Lipid panel  Vitamin D deficiency Continue supplementation -     VITAMIN D 25 Hydroxy (Vit-D Deficiency, Fractures)  Insomnia        -      Well managed by nightly trazodone       -      Diet/exercise, sleep hygiene discussed  Obesity Long discussion about weight loss, diet, and exercise Recommended diet heavy in fruits and veggies and low in animal meats, cheeses, and dairy products, appropriate calorie intake Discussed appropriate weight for height, goal <150, initially <`175 lb She plans to work on lifestyle; suggested app tracking, mindful eating; she will request nutritional counseling through work  Follow up at next visit  Screening for diabetes mellitus -     Hemoglobin A1c  Screening for hematuria or proteinuria -     Urinalysis, Complete (81001)  Medication management -     CBC with Differential/Platelet -     CMP/GFR -     Urinalysis, Complete (81001)   Discussed med's effects and SE's. Screening labs and tests as requested with regular follow-up as recommended. Over 40 minutes of exam, counseling, chart review, and complex, high level critical decision making was performed this visit.   Future Appointments  Date Time Provider [place]  12/05/2019  9:30 AM [name], NP [facility] None    HPI  48 y.o. Caucasian female,  presents for a complete physical and follow up for has Mixed hyperlipidemia; Vitamin D  deficiency; GERD (gastroesophageal reflux disease); Insomnia; Obesity (BMI 30.0-34.9); and Hyperthyroidism on their problem list.   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   She sees GYN annually for PAPs and 3D mammogram. No issues/concerns today.  She reports stress levels are significantly improved from last year; she is seeing a counselor every three to six weeks and is participating in a regular exercise program and weight watchers. She has a blended family, adult autistic son.   She continues with trazodone nightly which helps he fall back asleep and has significantly improved quality of life.   She reports headaches are down - resolve quickly with single tab of relpax.      Patient with hx of thyroid nodule, biopsy neg in 07/2012, follow up US in 08/2015 demonstrated multinodular thyroid, no nodules meeting criteria for further workup and recommended no further workup at that time. Patient does have family history of hypothyroid in her mother and father. TSHs have been monitored in this office, slowly trending down, most recent mildly hypothyroid at 0.22 on 11/29/17 without symptoms of hyperthyroid. Patient previously established with Dr. Cruzita Lederer for endocrinology. We discussed follow up labs through our office, patient prefered referral to endocrinology,  specifically to [name] per family recommendation. She underwent uptake study and ablation per his recommendations and now on synthroid, taking 100 mcg. She is getting levels checked q86m.   BMI is Body mass index is 32.71 kg/m., she has been working on  diet and exercise. Joined a workout group and enjoying this 4 days a week. She is following a whole foods diet.  Wt Readings from Last 3 Encounters:  12/09/18 199 lb 9.6 oz (90.5 kg)  11/29/17 185 lb (83.9 kg)  11/29/16 172 lb (78 kg)   Today their BP is BP: 106/76 She does workout. She denies chest pain, shortness of breath, dizziness.   She is not on  cholesterol medication (on red yeast rice supplement) and denies myalgias. Her cholesterol is not at goal. The cholesterol last visit was:   Lab Results  Component Value Date   CHOL 221 (H) 11/29/2017   HDL 81 11/29/2017   LDLCALC 118 (H) 11/29/2017   TRIG 113 11/29/2017   CHOLHDL 2.7 11/29/2017    Last A1C in the office was:  Lab Results  Component Value Date   HGBA1C 5.1 11/29/2017   She has hx of thyroid disease; thyroid nodule did not meet criteria for follow up in 2017; now followed by endocrinology:  Lab Results  Component Value Date   TSH 0.22 (L) 11/29/2017   Last GFR: Lab Results  Component Value Date   GFRNONAA 78 11/29/2017   Patient is on Vitamin D supplement and below goal at the last visit:    Lab Results  Component Value Date   VD25OH 37 11/29/2017     Lab Results  Component Value Date   VITAMINB12 584 12/01/2015    Current Medications:  Current Outpatient Medications on File Prior to Visit  Medication Sig Dispense Refill  . levothyroxine (SYNTHROID) 100 MCG tablet Take 100 mcg by mouth daily before breakfast.    . Ascorbic Acid (VITAMIN C) 1000 MG tablet Take 1,000 mg by mouth daily.    Marland Kitchen. aspirin 81 MG tablet Take 81 mg by mouth daily.    . calcium carbonate (OS-CAL) 600 MG TABS tablet Take 600 mg by mouth 2 (two) times daily with a meal.    . eletriptan (RELPAX) 40 MG tablet TAKE 1 TABLET BY MOUTH AS NEEDED FOR MIGRAINE OR HEADACHE MAY REPEAT IN 2 HOURS IF NEEDED 10 tablet 5  . fluticasone (FLONASE) 50 MCG/ACT nasal spray Place into both nostrils daily.    . Magnesium 250 MG TABS Take by mouth.    . meclizine (ANTIVERT) 25 MG tablet Take 1 tablet (25 mg total) by mouth 3 (three) times daily as needed for dizziness. 30 tablet 1  . montelukast (SINGULAIR) 10 MG tablet Take 1 tablet (10 mg total) by mouth daily. 30 tablet 2  . Multiple Vitamins-Minerals (MULTIVITAMIN WITH MINERALS) tablet Take 1 tablet by mouth. Three times per week    . OVER THE COUNTER  MEDICATION 1 tablet daily. [employer] brand allergy pill    . Red Yeast Rice 600 MG CAPS Take 600 mg by mouth 2 (two) times daily.    . traZODone (DESYREL) 150 MG tablet TAKE 1 TABLET (150 MG TOTAL) BY MOUTH AT BEDTIME. 90 tablet 1  . Vitamin D, Cholecalciferol, 1000 UNITS TABS Take 2,000 Units by mouth daily.     . Zinc 50 MG TABS Take by mouth daily.     No current facility-administered medications on file prior to visit.    Allergies:  Allergies  Allergen Reactions  . Tetracyclines & Related     Hives   Medical History:  She has Mixed hyperlipidemia; Vitamin D deficiency; GERD (gastroesophageal reflux disease); Insomnia; Obesity (BMI 30.0-34.9); and Hyperthyroidism on their problem list. Health Maintenance:   Immunization History  Administered Date(s) Administered  . Influenza Inj Mdck Quad Pf 11/06/2018  . Influenza-Unspecified 11/23/2016  . PPD Test 11/20/2013  . Td 01/17/2004  . Tdap 07/17/2013   Tetanus: 2015 Flu vaccine: 10/2018 Zostavax: n/a LMP: No LMP recorded. (Menstrual status: IUD). Pap: 10/2017 Sees GYN annually  MGM: 10/2017 - 3D performed in Dr. Carmin Richmond office   Colonoscopy: n/a EGD: 2016  Last Dental Exam: goes q 93m, last 2020, Dr. Esau Grew Last Eye Exam: Annually - last 2020, Dr. Sharlot Gowda - [facility] - no issues Last Skin Exam: Annually with Dr. Emily Filbert   Patient Care Team: Lucky Cowboy, MD as PCP - General (Internal Medicine) Candice Camp, MD as Consulting Physician (Obstetrics and Gynecology) Amelia Jo, MD as Consulting Physician (Neurology) Elmon Else, MD as Consulting Physician (Dermatology)  Surgical History:  She has a past surgical history that includes Gynecologic cryosurgery and Mole removal. Family History:  Herfamily history includes Cancer in her maternal grandfather; Colon polyps in her mother; Dementia in her maternal grandmother; Diabetes in an other family member; Heart disease in her maternal grandfather; Hyperlipidemia in her  mother; Hypertension in her mother; Hypothyroidism in her father and mother; Irritable bowel syndrome in her mother; Osteopenia in her paternal grandmother; Stroke in her paternal grandfather. Social History:  She reports that she has never smoked. She has never used smokeless tobacco. She reports current alcohol use. She reports that she does not use drugs.  Review of Systems: Review of Systems  Constitutional: Negative for malaise/fatigue and weight loss.  HENT: Negative for hearing loss and tinnitus.   Eyes: Negative for blurred vision and double vision.  Respiratory: Negative for cough, shortness of breath and wheezing.   Cardiovascular: Negative for chest pain, palpitations, orthopnea, claudication and leg swelling.  Gastrointestinal: Negative for abdominal pain, blood in stool, constipation, diarrhea, heartburn, melena, nausea and vomiting.  Genitourinary: Negative.   Musculoskeletal: Negative for joint pain and myalgias.  Skin: Negative for rash.  Neurological: Negative for dizziness, tingling, sensory change, weakness and headaches.  Endo/Heme/Allergies: Positive for environmental allergies. Negative for polydipsia.  Psychiatric/Behavioral: Negative for depression, substance abuse and suicidal ideas. The patient has insomnia. The patient is not nervous/anxious.   All other systems reviewed and are negative.   Physical Exam: Estimated body mass index is 32.71 kg/m as calculated from the following:   Height as of this encounter: 5' 5.5" (1.664 m).   Weight as of this encounter: 199 lb 9.6 oz (90.5 kg). BP 106/76   Pulse 65   Temp (!) 97.4 F (36.3 C)   Resp 16   Ht 5' 5.5" (1.664 m)   Wt 199 lb 9.6 oz (90.5 kg)   SpO2 91%   BMI 32.71 kg/m  General Appearance: Well nourished, in no apparent distress.  Eyes: PERRLA, EOMs, conjunctiva no swelling or erythema, normal fundi and vessels.  Sinuses: No Frontal/maxillary tenderness  ENT/Mouth: Ext aud canals clear, normal light  reflex with TMs without erythema, bulging. Good dentition. No erythema, swelling, or exudate on post pharynx. Tonsils not swollen or erythematous. Hearing normal.  Neck: Supple, thyroid normal. No bruits  Respiratory: Respiratory effort normal, BS equal bilaterally without rales, rhonchi, wheezing or stridor.  Cardio: RRR without murmurs, rubs or gallops. Brisk peripheral pulses without edema.  Chest: symmetric, with normal excursions and percussion.  Breasts: Defer to GYN Abdomen: Soft, nontender, no guarding, rebound, hernias, masses, or organomegaly.  Lymphatics: Non tender without lymphadenopathy.  Genitourinary: Defer to GYN Musculoskeletal: Full ROM all peripheral extremities,5/5  strength, and normal gait.  Skin: Warm, dry without rashes, lesions, ecchymosis. Neuro: Cranial nerves intact, reflexes equal bilaterally. Normal muscle tone, no cerebellar symptoms. Sensation intact.  Psych: Awake and oriented X 3, normal affect, Insight and Judgment appropriate.   EKG: Sinus brady -WNL- in 2019 - defer this year    Dan Maker 9:34 AM [facility]

## 2018-12-09 ENCOUNTER — Other Ambulatory Visit: Payer: Self-pay

## 2018-12-09 ENCOUNTER — Ambulatory Visit (INDEPENDENT_AMBULATORY_CARE_PROVIDER_SITE_OTHER): Payer: BC Managed Care – PPO | Admitting: Adult Health

## 2018-12-09 ENCOUNTER — Encounter: Payer: Self-pay | Admitting: Adult Health

## 2018-12-09 VITALS — BP 106/76 | HR 65 | Temp 97.4°F | Resp 16 | Ht 65.5 in | Wt 199.6 lb

## 2018-12-09 DIAGNOSIS — Z79899 Other long term (current) drug therapy: Secondary | ICD-10-CM

## 2018-12-09 DIAGNOSIS — E782 Mixed hyperlipidemia: Secondary | ICD-10-CM

## 2018-12-09 DIAGNOSIS — E669 Obesity, unspecified: Secondary | ICD-10-CM

## 2018-12-09 DIAGNOSIS — Z130 Encounter for screening for diseases of the blood and blood-forming organs and certain disorders involving the immune mechanism: Secondary | ICD-10-CM | POA: Diagnosis not present

## 2018-12-09 DIAGNOSIS — E041 Nontoxic single thyroid nodule: Secondary | ICD-10-CM

## 2018-12-09 DIAGNOSIS — Z13220 Encounter for screening for lipoid disorders: Secondary | ICD-10-CM

## 2018-12-09 DIAGNOSIS — E559 Vitamin D deficiency, unspecified: Secondary | ICD-10-CM

## 2018-12-09 DIAGNOSIS — Z1389 Encounter for screening for other disorder: Secondary | ICD-10-CM

## 2018-12-09 DIAGNOSIS — Z131 Encounter for screening for diabetes mellitus: Secondary | ICD-10-CM

## 2018-12-09 DIAGNOSIS — Z0000 Encounter for general adult medical examination without abnormal findings: Secondary | ICD-10-CM

## 2018-12-09 DIAGNOSIS — E538 Deficiency of other specified B group vitamins: Secondary | ICD-10-CM

## 2018-12-09 DIAGNOSIS — G4700 Insomnia, unspecified: Secondary | ICD-10-CM

## 2018-12-09 DIAGNOSIS — Z1329 Encounter for screening for other suspected endocrine disorder: Secondary | ICD-10-CM

## 2018-12-09 DIAGNOSIS — E0590 Thyrotoxicosis, unspecified without thyrotoxic crisis or storm: Secondary | ICD-10-CM

## 2018-12-09 DIAGNOSIS — K219 Gastro-esophageal reflux disease without esophagitis: Secondary | ICD-10-CM

## 2018-12-09 NOTE — Patient Instructions (Signed)
Shawna Forbes , Thank you for taking time to come for your Annual Wellness Visit. I appreciate your ongoing commitment to your health goals. Please review the following plan we discussed and let me know if I can assist you in the future.   These are the goals we discussed: Goals    . LDL CALC < 100    . Weight (lb) < 175 lb (79.4 kg)       This is a list of the screening recommended for you and due dates:  Health Maintenance  Topic Date Due  . HIV Screening  12/26/2019*  . Pap Smear  10/16/2020  . Tetanus Vaccine  07/18/2023  . Flu Shot  Completed  *Topic was postponed. The date shown is not the original due date.     Know what a healthy weight is for you (roughly BMI <25) and aim to maintain this  Aim for 7+ servings of fruits and vegetables daily  65-80+ fluid ounces of water or unsweet tea for healthy kidneys  Limit to max 1 drink of alcohol per day; avoid smoking/tobacco  Limit animal fats in diet for cholesterol and heart health - choose grass fed whenever available  Avoid highly processed foods, and foods high in saturated/trans fats  Aim for low stress - take time to unwind and care for your mental health  Aim for 150 min of moderate intensity exercise weekly for heart health, and weights twice weekly for bone health  Aim for 7-9 hours of sleep daily     Drink 1/2 your body weight in fluid ounces of water daily; drink a tall glass of water 30 min before meals  Don't eat until you're stuffed- listen to your stomach and eat until you are 80% full   Try eating off of a salad plate; wait 10 min after finishing before going back for seconds  Start by eating the vegetables on your plate; aim for 16%50% of your meals to be fruits or vegetables  Then eat your protein - lean meats (grass fed if possible), fish, beans, nuts in moderation  Eat your carbs/starch last ONLY if you still are hungry. If you can, stop before finishing it all  Avoid sugar and flour - the  closer it looks to it's original form in nature, typically the better it is for you  Splurge in moderation - "assign" days when you get to splurge and have the "bad stuff" - I like to follow a 80% - 20% plan- "good" choices 80 % of the time, "bad" choices in moderation 20% of the time  Simple equation is: Calories out > calories in = weight loss - even if you eat the bad stuff, if you limit portions, you will still lose weight   Consider tracking your activity and intake for 4-6 weeks with an app such as MyFitnessPAL or Lose it! - make sure you measure everything. This helps identify any unrealized sources of diet bombs and gives you information - also pay attention to macros - ? Too much carbs, not enough protein, etc       Google mindful eating and here are some tips and tricks below.   Rate your hunger before you eat on a scale of 1-10, try to eat closer to a 6 or higher. And if you are at below that, why are you eating? Slow down and listen to your body.           Who Qualifies for Obesity Medications? Although everyone  is hopeful for a fast and easy way to lose weight, nothing has been shown to replace a prudent, calorie-controlled diet along with behavior modification as a cornerstone for all obesity treatments.   The next tool that can be used to achieve weight-loss and health improvement is medication.   Pharmacotherapy may be offered to Individuals affected by obesity who have failed to achieve weight-loss through diet and exercise alone.  Currently there are several drugs that are approved by the FDA for weight-loss: . phentermine products (Adipex-P or Suprenza)  . phentermine- topiramate ER (Qsymia)  . Bupropion; Naltrexone ER (Contrave)  . Saxenda (liraglutide), once a day weight loss shot .  Let's take a closer look at each of these medications and learn how they work:   Phentermine-Topiramate ER (Qsymia) How does it work? This combination medication was  approved by the FDA in July 2012. Topiramate is a medication used to treat seizures. It was found that a common side effect of this medication was weight-loss. Phentermine, as described in this brochure, helps to increase your energy and decrease your appetite.  Concerns: The most common side effects were dry mouth, constipation and pins-and-needle feeling in extremities.  Qsymia should NOT be taken during pregnancy since Topiramate ER, a component of Qsymia, has been associated with an increased risk of birth defects.  MEDIATIONS SEPARATED: PHENTERMINE How does it work? Phentermine is a medication available by prescription that works on chemicals in the brain to decrease your appetite. It also has a mild stimulant component that adds extra energy. Phentermine is a pill that is taken once a day in the morning time.   Tolerance to this medication normally develops, so it can only be used for several months at a time.  Common side effects are dry mouth, sleeplessness, constipation.  Concerns: Due to its stimulant effect, a person's blood pressure and heart rate may increase when on this medication; therefore, you must be monitored closely by a physician who is experienced in prescribing this medication. It cannot be used in patients with some heart conditions (such as poorly controlled blood pressure), glaucoma (increased pressure in your eye), stroke or overactive thyroid. There is some concern for abuse, but this is minimal if the medication is appropriately used as directed by a healthcare professional.  TOPIRAMATE Sometimes we will prescribe topiramate AND phentermine together to make Qsymia- separating the medications makes it cheaper.  How to start it start on 1/2 pill for 3-5 nights, can increase to a whole pill for 1-2 weeks.  This medication is good for weight loss, headaches, pain This medication can cause numbness, tingling and can cause brain fog- stop if you get these Check with  your eye doctor if you have a history of glaucoma and stop if you severe vision changes or blurry vision.    Bupropion; Naltrexone ER (Contrave) How does it work? Works in two areas of your brain, hunger center and reward center to reduce hunger and cravings.   Concerns Most common side effects are dry mouth, constipation or diarrhea, headache.  Please take it with a full glass of water and low fat meal.   MEDICATIONS SEPARATED: WELLBUTRIN Only antidepressant that helps with weight loss Used frequently for seasonal effective disorder If you get nausea and HA after the first week please stop- can cause people to clench their jaw- stop it.  If you get anxious or snappy with people than stop the medication But this medication can help with energy, weight loss and mood  It kicks in about 1-2 weeks And can be stopped quickly  NALTREXONE You can NOT take this medication if you are on an opioid.  It is used to treat patients that have cravings/addiction to alcohol/opioids.  Taken with supper/dinner.   SAXENDA Is an expensive once a day injectable that helps decrease appetite.  You usually have to fail other therapies and show 6 months of trying before we can get it approved.  Can cause nausea.   VYVANSE Is a stimulant that is approved for binge eating disorder.    Follow-up Visits: Patients are given the opportunity to revisit a topic or obtain more information on an area of interest during follow-up visits.  The frequency of and interval between follow-up visits is determined on a patient-by-patient basis.   Frequent visits (every 3 to 4 weeks) are encouraged until initial weight-loss goals (5 to 10 percent of body weight) are achieved.   At that point, less frequent visits are typically scheduled as needed for individual patients. However, since obesity is considered a chronic life-long problem for many individuals, periodic continual follow up is recommended.  Research has shown  that weight-loss as low as 5 percent of initial body weight can lead to favorable improvements in blood pressure, cholesterol, glucose levels and insulin sensitivity. The risk of developing heart disease is reduced the most in patients who have impaired glucose tolerance, type 2 diabetes or high blood pressure.

## 2018-12-10 LAB — CBC WITH DIFFERENTIAL/PLATELET
Absolute Monocytes: 561 cells/uL (ref 200–950)
Basophils Absolute: 53 cells/uL (ref 0–200)
Basophils Relative: 0.8 %
Eosinophils Absolute: 112 cells/uL (ref 15–500)
Eosinophils Relative: 1.7 %
HCT: 41.7 % (ref 35.0–45.0)
Hemoglobin: 13.9 g/dL (ref 11.7–15.5)
Lymphs Abs: 1815 cells/uL (ref 850–3900)
MCH: 30.2 pg (ref 27.0–33.0)
MCHC: 33.3 g/dL (ref 32.0–36.0)
MCV: 90.7 fL (ref 80.0–100.0)
MPV: 11.2 fL (ref 7.5–12.5)
Monocytes Relative: 8.5 %
Neutro Abs: 4059 cells/uL (ref 1500–7800)
Neutrophils Relative %: 61.5 %
Platelets: 286 10*3/uL (ref 140–400)
RBC: 4.6 10*6/uL (ref 3.80–5.10)
RDW: 12.3 % (ref 11.0–15.0)
Total Lymphocyte: 27.5 %
WBC: 6.6 10*3/uL (ref 3.8–10.8)

## 2018-12-10 LAB — TSH: TSH: 1.71 mIU/L

## 2018-12-10 LAB — COMPLETE METABOLIC PANEL WITH GFR
AG Ratio: 1.4 (calc) (ref 1.0–2.5)
ALT: 26 U/L (ref 6–29)
AST: 25 U/L (ref 10–35)
Albumin: 4.5 g/dL (ref 3.6–5.1)
Alkaline phosphatase (APISO): 40 U/L (ref 31–125)
BUN: 15 mg/dL (ref 7–25)
CO2: 24 mmol/L (ref 20–32)
Calcium: 10.1 mg/dL (ref 8.6–10.2)
Chloride: 104 mmol/L (ref 98–110)
Creat: 0.83 mg/dL (ref 0.50–1.10)
GFR, Est African American: 97 mL/min/{1.73_m2} (ref >=60)
GFR, Est Non African American: 84 mL/min/{1.73_m2} (ref >=60)
Globulin: 3.3 g/dL (calc) (ref 1.9–3.7)
Glucose, Bld: 75 mg/dL (ref 65–99)
Potassium: 4.4 mmol/L (ref 3.5–5.3)
Sodium: 138 mmol/L (ref 135–146)
Total Bilirubin: 0.5 mg/dL (ref 0.2–1.2)
Total Protein: 7.8 g/dL (ref 6.1–8.1)

## 2018-12-10 LAB — URINALYSIS, ROUTINE W REFLEX MICROSCOPIC
Bilirubin Urine: NEGATIVE
Glucose, UA: NEGATIVE
Hgb urine dipstick: NEGATIVE
Ketones, ur: NEGATIVE
Leukocytes,Ua: NEGATIVE
Nitrite: NEGATIVE
Protein, ur: NEGATIVE
Specific Gravity, Urine: 1.008 (ref 1.001–1.03)
pH: 6.5 (ref 5.0–8.0)

## 2018-12-10 LAB — HEMOGLOBIN A1C
Hgb A1c MFr Bld: 5.1 % of total Hgb (ref <5.7)
Mean Plasma Glucose: 100 (calc)
eAG (mmol/L): 5.5 (calc)

## 2018-12-10 LAB — VITAMIN B12: Vitamin B-12: 1186 pg/mL — ABNORMAL HIGH (ref 200–1100)

## 2018-12-10 LAB — LIPID PANEL
Cholesterol: 237 mg/dL — ABNORMAL HIGH (ref <200)
HDL: 75 mg/dL (ref >=50)
LDL Cholesterol (Calc): 136 mg/dL (calc) — ABNORMAL HIGH
Non-HDL Cholesterol (Calc): 162 mg/dL (calc) — ABNORMAL HIGH (ref <130)
Total CHOL/HDL Ratio: 3.2 (calc) (ref <5.0)
Triglycerides: 132 mg/dL (ref <150)

## 2018-12-10 LAB — VITAMIN D 25 HYDROXY (VIT D DEFICIENCY, FRACTURES): Vit D, 25-Hydroxy: 32 ng/mL (ref 30–100)

## 2018-12-10 LAB — MAGNESIUM: Magnesium: 2.1 mg/dL (ref 1.5–2.5)

## 2019-04-15 ENCOUNTER — Other Ambulatory Visit: Payer: Self-pay

## 2019-04-15 ENCOUNTER — Other Ambulatory Visit: Payer: Self-pay | Admitting: Adult Health

## 2019-04-15 DIAGNOSIS — F331 Major depressive disorder, recurrent, moderate: Secondary | ICD-10-CM

## 2019-04-15 MED ORDER — ELETRIPTAN HYDROBROMIDE 40 MG PO TABS: ORAL_TABLET | ORAL | 5 refills | Status: DC

## 2019-07-27 ENCOUNTER — Other Ambulatory Visit: Payer: Self-pay | Admitting: Internal Medicine

## 2019-07-27 DIAGNOSIS — F331 Major depressive disorder, recurrent, moderate: Secondary | ICD-10-CM

## 2019-08-21 ENCOUNTER — Other Ambulatory Visit: Payer: Self-pay

## 2019-08-21 ENCOUNTER — Ambulatory Visit (INDEPENDENT_AMBULATORY_CARE_PROVIDER_SITE_OTHER): Payer: 59 | Admitting: Podiatry

## 2019-08-21 ENCOUNTER — Encounter: Payer: Self-pay | Admitting: Podiatry

## 2019-08-21 DIAGNOSIS — B351 Tinea unguium: Secondary | ICD-10-CM

## 2019-08-21 MED ORDER — TERBINAFINE HCL 250 MG PO TABS: ORAL_TABLET | ORAL | 0 refills | Status: DC

## 2019-08-21 NOTE — Progress Notes (Signed)
Subjective:   Patient ID: Shawna Forbes, female   DOB: 49 y.o.   MRN: [id number]   HPI Patient presents stating that she is got nail disease has had a history of thyroid problems and several years ago her nail became thinner and she has had trouble since that.  Patient would like to be able to get this fungal under control if possible stating the left and right big toenails are the worst with the right being worse currently and patient does not smoke   Review of Systems  All other systems reviewed and are negative.       Objective:  Physical Exam Vitals and nursing note reviewed.  Constitutional:      Appearance: She is well-developed.  Pulmonary:     Effort: Pulmonary effort is normal.  Musculoskeletal:        General: Normal range of motion.  Skin:    General: Skin is warm.  Neurological:     Mental Status: She is alert.     Neurovascular status intact muscle strength found to be adequate range of motion within normal limits.  Patient is found to have discoloration of the nailbeds right hallux and several other nails with thickened nails which are probably due to thyroid issues for the last few years and are now under control.  Patient has good digital perfusion well oriented x3     Assessment:  Probability for nail disease with thinning of toenail secondary to the probable thyroid issues she had with subsequent mycotic infection of nailbeds F8 H&P reviewed conditions and I have recommended utilization of pulse antifungal therapy along with laser therapy.  I reviewed this with her she wants to have this done and at this point she is scheduled for this procedure to be done     Plan:  Above list of the laser and antifungal therapy we will pursue with this patient      lam

## 2019-08-29 ENCOUNTER — Ambulatory Visit (INDEPENDENT_AMBULATORY_CARE_PROVIDER_SITE_OTHER): Payer: 59 | Admitting: *Deleted

## 2019-08-29 ENCOUNTER — Other Ambulatory Visit: Payer: Self-pay

## 2019-08-29 DIAGNOSIS — B351 Tinea unguium: Secondary | ICD-10-CM

## 2019-08-29 NOTE — Patient Instructions (Signed)

## 2019-08-29 NOTE — Progress Notes (Signed)
Patient presents today for the 1st laser treatment. Diagnosed with mycotic nail infection by Dr. Charlsie Merles.   Most of the nails are affected, except the hallux left.  All other systems are negative.  Nails were filed thin. Laser therapy was administered to 1-5 toenails bilateral and patient tolerated the treatment well. All safety precautions were in place.   Patient also started a pulse dosing of terbinafine on 08/25/19 x 4 months.   Follow up in 4 weeks for laser # 2.  Picture of nails taken today to document visual progress

## 2019-10-03 ENCOUNTER — Ambulatory Visit: Payer: 59 | Admitting: *Deleted

## 2019-10-03 ENCOUNTER — Other Ambulatory Visit: Payer: Self-pay

## 2019-10-03 DIAGNOSIS — B351 Tinea unguium: Secondary | ICD-10-CM

## 2019-10-03 NOTE — Progress Notes (Signed)
Patient presents today for the 2nd laser treatment. Diagnosed with mycotic nail infection by Dr. Charlsie Merles.   Most of the nails are affected, except the hallux left. There is already some new growth showing at the base of the hallux nail left.  All other systems are negative.  Nails were filed thin. Laser therapy was administered to 1-5 toenails bilateral and patient tolerated the treatment well. All safety precautions were in place.   Patient also started a pulse dosing of terbinafine on 08/25/19 x 4 months.   Follow up in 4 weeks for laser # 3.

## 2019-10-26 ENCOUNTER — Other Ambulatory Visit: Payer: Self-pay | Admitting: Adult Health

## 2019-10-26 DIAGNOSIS — F331 Major depressive disorder, recurrent, moderate: Secondary | ICD-10-CM

## 2019-10-31 ENCOUNTER — Other Ambulatory Visit: Payer: Self-pay

## 2019-10-31 ENCOUNTER — Ambulatory Visit (INDEPENDENT_AMBULATORY_CARE_PROVIDER_SITE_OTHER): Payer: 59 | Admitting: *Deleted

## 2019-10-31 DIAGNOSIS — B351 Tinea unguium: Secondary | ICD-10-CM

## 2019-10-31 NOTE — Progress Notes (Signed)
Patient presents today for the 3rd laser treatment. Diagnosed with mycotic nail infection by Dr. Charlsie Merles.   Most of the nails are affected, except the hallux left. There is already some new growth showing at the base of the hallux nail left.  All other systems are negative.  Nails were filed thin. Laser therapy was administered to 1-5 toenails bilateral and patient tolerated the treatment well. All safety precautions were in place.   Patient also started a pulse dosing of terbinafine on 08/25/19 x 4 months. She has one month left   Follow up in 6 weeks for laser # 4.

## 2019-12-05 ENCOUNTER — Other Ambulatory Visit: Payer: 59

## 2019-12-05 ENCOUNTER — Encounter: Payer: BLUE CROSS/BLUE SHIELD | Admitting: Adult Health

## 2019-12-10 ENCOUNTER — Encounter: Payer: Self-pay | Admitting: Adult Health

## 2019-12-10 DIAGNOSIS — E039 Hypothyroidism, unspecified: Secondary | ICD-10-CM | POA: Insufficient documentation

## 2019-12-10 NOTE — Progress Notes (Signed)
Complete Physical  Assessment and Plan:  Diagnoses and all orders for this visit:  Encounter for routine adult health examination with abnormal findings Schedule follow up with GYN, reports requested   Gastroesophageal reflux disease without esophagitis Well managed by lifestyle; uses famotidine  -     Magnesium  Right thyroid nodule/ thyroid S/p ablation followed by endocrinology  Check TSH Synthroid managed by endocrine Will request reports  Mixed hyperlipidemia Continue low cholesterol diet, red rice yeast supplement Low risk family history Strong preference to avoid medications LDL goal to maintain <130 Consider cardio IQ or coronary calcium if trending up  -     TSH -     Lipid panel  Vitamin D deficiency Continue supplementation -     VITAMIN D 25 Hydroxy (Vit-D Deficiency, Fractures)  Insomnia        -      Well managed by nightly trazodone       -      Diet/exercise, sleep hygiene discussed  Overweight Commended excellent progress with weight loss this year Recommended diet heavy in fruits and veggies and low in animal meats, cheeses, and dairy products, appropriate calorie intake Discussed appropriate weight for height, goal <150,  Continue with current lifestyle changes, discussed low processed high fiber diet Follow up at next visit  Screening for diabetes mellitus -     Defer - at goal last year, weight loss  Screening for hematuria or proteinuria -     Urinalysis, Complete (81001)  Medication management -     CBC with Differential/Platelet -     CMP/GFR -     Urinalysis, Complete (81001)  Screening colon cancer Discussed new guidelines suggesting screening start at age 13  Colonoscopy- patient declines a colonoscopy even though the risks and benefits were discussed at length. Colon cancer is 3rd most diagnosed cancer and 2nd leading cause of death in both men and women 67 years of age and older. Patient understands the risk of cancer and death with  declining the test however they are willing to do cologuard screening instead. They understand that this is not as sensitive or specific as a colonoscopy and they are still recommended to get a colonoscopy. The cologuard will be sent out to their house.    Discussed med's effects and SE's. Screening labs and tests as requested with regular follow-up as recommended. Over 40 minutes of exam, counseling, chart review, and complex, high level critical decision making was performed this visit.   Future Appointments  Date Time Provider Department Center  12/26/2019  8:30 AM [facility] NURSE [facility]  12/15/2020  9:00 AM Judd Gaudier, NP [facility] None    HPI  49 y.o. Caucasian female,  presents for a complete physical and follow up for has Mixed hyperlipidemia; Vitamin D deficiency; GERD (gastroesophageal reflux disease); Insomnia; Obesity (BMI 30.0-34.9); and Hypothyroid on their problem list.   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   She sees GYN Dr. Rana Snare annually for PAPs and 3D mammogram. No issues/concerns today. On Mirena.   She reports stress levels are significantly improved from last year; she is seeing a counselor every three to six weeks and is participating in a regular exercise program and weight watchers. She has a blended family, adult autistic son. She continues with trazodone nightly which helps he fall back asleep and has significantly improved quality of life.   She reports headaches are down - resolve quickly with single tab of relpax.  Only having with weather changes.   She is now seeing podiatrist for onychomychosis regularly, having laser treatments and doing lamisil with perceived improvement.   BMI is Body mass index is 28.22 kg/m., she has been working on diet and exercise. She has been working on diet/exercise, down from 199 lb on home scale in 03/2019 to 167 lb. Doing lower starch, high fiber. She reports previous mild GERD seems to  have resolved with weight loss, having better BMs.  Wt Readings from Last 3 Encounters:  12/15/19 167 lb (75.8 kg)  12/09/18 199 lb 9.6 oz (90.5 kg)  11/29/17 185 lb (83.9 kg)   Today their BP is BP: 106/70 She does workout. She denies chest pain, shortness of breath, dizziness.   She is not on cholesterol medication (on red yeast rice supplement) and denies myalgias. Her cholesterol is not at goal. She has been working on lifestyle to avoid cholesterol medication. Denies family history of MI/CVA until grandfather in his 5590s.  The cholesterol last visit was:   Lab Results  Component Value Date   CHOL 237 (H) 12/09/2018   HDL 75 12/09/2018   LDLCALC 136 (H) 12/09/2018   TRIG 132 12/09/2018   CHOLHDL 3.2 12/09/2018    Last A1C in the office was:  Lab Results  Component Value Date   HGBA1C 5.1 12/09/2018    Patient with hx of thyroid nodule, mild hyperthyroid, was referred to Dr. Adrian PrinceStephen [place] and underwent uptake study and ablation per his recommendations and now on synthroid, taking 100 mcg. She is getting levels checked q7160m Lab Results  Component Value Date   TSH 1.71 12/09/2018   Last GFR: Lab Results  Component Value Date   GFRNONAA 84 12/09/2018   Patient is on Vitamin D supplement and below goal at the last visit, taking 5000 IU daily:    Lab Results  Component Value Date   VD25OH 32 12/09/2018     She is on B12 supplement;  Lab Results  Component Value Date   VITAMINB12 1,186 (H) 12/09/2018     Current Medications:  Current Outpatient Medications on File Prior to Visit  Medication Sig Dispense Refill  . Ascorbic Acid (VITAMIN C) 1000 MG tablet Take 1,000 mg by mouth daily.    Marland Kitchen. aspirin 81 MG tablet Take 81 mg by mouth daily.    . calcium carbonate (OS-CAL) 600 MG TABS tablet Take 600 mg by mouth 2 (two) times daily with a meal.    . eletriptan (RELPAX) 40 MG tablet TAKE 1 TABLET BY MOUTH AS NEEDED FOR MIGRAINE OR HEADACHE MAY REPEAT IN 2 HOURS IF NEEDED 10  tablet 5  . fluticasone (FLONASE) 50 MCG/ACT nasal spray Place into both nostrils daily.    Marland Kitchen. levonorgestrel (MIRENA, 52 MG,) 20 MCG/24HR IUD Mirena 20 mcg/24 hours (7 yrs) 52 mg intrauterine device  Take 1 device by intrauterine route.    Marland Kitchen. levothyroxine (SYNTHROID) 100 MCG tablet Take 100 mcg by mouth daily before breakfast.    . Magnesium 250 MG TABS Take by mouth.    . meclizine (ANTIVERT) 25 MG tablet Take 1 tablet (25 mg total) by mouth 3 (three) times daily as needed for dizziness. 30 tablet 1  . Multiple Vitamins-Minerals (MULTIVITAMIN WITH MINERALS) tablet Take 1 tablet by mouth. Three times per week    . OVER THE COUNTER MEDICATION 1 tablet daily. [employer] brand allergy pill    . Red Yeast Rice 600 MG CAPS Take 600 mg by mouth 2 (two)  times daily.    Marland Kitchen terbinafine (LAMISIL) 250 MG tablet Please take one a day x 7days, repeat every 4 weeks x 4 months 28 tablet 0  . traZODone (DESYREL) 150 MG tablet Take    1 tablet     at Bedtime      as needed for Sleep 90 tablet 0  . Vitamin D, Cholecalciferol, 1000 UNITS TABS Take 2,000 Units by mouth daily.     . Zinc 50 MG TABS Take by mouth daily.     No current facility-administered medications on file prior to visit.   Allergies:  Allergies  Allergen Reactions  . Tetracyclines & Related     Hives  . Thimerosal Other (See Comments)    Son with autism and has been informed to never have vaccine that contains thimersal    Medical History:  She has Mixed hyperlipidemia; Vitamin D deficiency; GERD (gastroesophageal reflux disease); Insomnia; Obesity (BMI 30.0-34.9); and Hypothyroid on their problem list. Health Maintenance:   Immunization History  Administered Date(s) Administered  . Influenza Inj Mdck Quad Pf 11/06/2018  . Influenza-Unspecified 11/23/2016  . PPD Test 11/20/2013  . Td 01/17/2004  . Tdap 07/17/2013   Tetanus: 2015 Flu vaccine: fall 2021 at pharmacy  Zostavax: n/a LMP: No LMP recorded. (Menstrual status:  IUD). Pap/pelvic: 2020 Sees GYN annually  MGM: 2020 in Dr. Vance Gather office  - report requested - she states will schedule for this year  Colonoscopy: - declines, no family history - discussed new guidelines starting at age 31, wants to proceed with cologuard  EGD: 2016  Last Dental Exam: goes q 54m, last 2021, Dr. Esau Grew Last Eye Exam: Annually - last 2021, Dr. Sharlot Gowda - [facility] - no issues Last Skin Exam: a few years back with Dr. Emily Filbert - will schedule follow up  Patient Care Team: Lucky Cowboy, MD as PCP - General (Internal Medicine) Candice Camp, MD as Consulting Physician (Obstetrics and Gynecology) Amelia Jo, MD as Consulting Physician (Neurology) Elmon Else, MD as Consulting Physician (Dermatology)  Surgical History:  She has a past surgical history that includes Gynecologic cryosurgery and Mole removal. Family History:  Herfamily history includes Cancer in her maternal grandfather; Colon polyps in her mother; Dementia in her maternal grandmother; Diabetes in an other family member; Heart disease in her maternal grandfather; Hyperlipidemia in her mother; Hypertension in her mother; Hypothyroidism in her father and mother; Irritable bowel syndrome in her mother; Osteopenia in her paternal grandmother; Stroke in her paternal grandfather. Social History:  She reports that she has never smoked. She has never used smokeless tobacco. She reports current alcohol use. She reports that she does not use drugs.  Review of Systems: Review of Systems  Constitutional: Negative for malaise/fatigue and weight loss.  HENT: Negative for hearing loss and tinnitus.   Eyes: Negative for blurred vision and double vision.  Respiratory: Negative for cough, shortness of breath and wheezing.   Cardiovascular: Negative for chest pain, palpitations, orthopnea, claudication and leg swelling.  Gastrointestinal: Negative for abdominal pain, blood in stool, constipation, diarrhea, heartburn,  melena, nausea and vomiting.  Genitourinary: Negative.   Musculoskeletal: Negative for joint pain and myalgias.  Skin: Negative for rash.  Neurological: Negative for dizziness, tingling, sensory change, weakness and headaches.  Endo/Heme/Allergies: Positive for environmental allergies. Negative for polydipsia.  Psychiatric/Behavioral: Negative for depression, substance abuse and suicidal ideas. The patient has insomnia. The patient is not nervous/anxious.   All other systems reviewed and are negative.   Physical Exam:  Estimated body mass index is 28.22 kg/m as calculated from the following:   Height as of this encounter: 5' 4.5" (1.638 m).   Weight as of this encounter: 167 lb (75.8 kg). BP 106/70   Pulse 68   Temp (!) 97.5 F (36.4 C)   Ht 5' 4.5" (1.638 m)   Wt 167 lb (75.8 kg)   SpO2 97%   BMI 28.22 kg/m  General Appearance: Well nourished, in no apparent distress.  Eyes: PERRLA, EOMs, conjunctiva no swelling or erythema Sinuses: No Frontal/maxillary tenderness  ENT/Mouth: Ext aud canals clear, normal light reflex with TMs without erythema, bulging. Good dentition. No erythema, swelling, or exudate on post pharynx. Tonsils not swollen or erythematous. Hearing normal.  Neck: Supple, thyroid normal. No bruits  Respiratory: Respiratory effort normal, BS equal bilaterally without rales, rhonchi, wheezing or stridor.  Cardio: RRR without murmurs, rubs or gallops. Brisk peripheral pulses without edema.  Chest: symmetric, with normal excursions and percussion.  Breasts: Defer to GYN Abdomen: Soft, nontender, no guarding, rebound, hernias, masses, or organomegaly.  Lymphatics: Non tender without lymphadenopathy.  Genitourinary: Defer to GYN Musculoskeletal: Full ROM all peripheral extremities,5/5 strength, and normal gait.  Skin: Warm, dry without rashes, ecchymosis. She has approx 8 mm brown mildly raised nevi to R lower back (has seen derm for this). Bil toenails with irregular  texture distally, normal growth at base Neuro: Cranial nerves intact, reflexes equal bilaterally. Normal muscle tone, no cerebellar symptoms. Sensation intact.  Psych: Awake and oriented X 3, normal affect, Insight and Judgment appropriate.   EKG: sinus rhythm with PAC    Shawna Forbes 9:34 AM [facility]

## 2019-12-15 ENCOUNTER — Other Ambulatory Visit: Payer: Self-pay

## 2019-12-15 ENCOUNTER — Encounter: Payer: Self-pay | Admitting: Adult Health

## 2019-12-15 ENCOUNTER — Ambulatory Visit (INDEPENDENT_AMBULATORY_CARE_PROVIDER_SITE_OTHER): Payer: 59 | Admitting: Adult Health

## 2019-12-15 VITALS — BP 106/70 | HR 68 | Temp 97.5°F | Ht 64.5 in | Wt 167.0 lb

## 2019-12-15 DIAGNOSIS — Z8249 Family history of ischemic heart disease and other diseases of the circulatory system: Secondary | ICD-10-CM

## 2019-12-15 DIAGNOSIS — R030 Elevated blood-pressure reading, without diagnosis of hypertension: Secondary | ICD-10-CM

## 2019-12-15 DIAGNOSIS — E0590 Thyrotoxicosis, unspecified without thyrotoxic crisis or storm: Secondary | ICD-10-CM

## 2019-12-15 DIAGNOSIS — Z136 Encounter for screening for cardiovascular disorders: Secondary | ICD-10-CM | POA: Diagnosis not present

## 2019-12-15 DIAGNOSIS — Z1211 Encounter for screening for malignant neoplasm of colon: Secondary | ICD-10-CM

## 2019-12-15 DIAGNOSIS — Z0000 Encounter for general adult medical examination without abnormal findings: Secondary | ICD-10-CM

## 2019-12-15 DIAGNOSIS — K219 Gastro-esophageal reflux disease without esophagitis: Secondary | ICD-10-CM

## 2019-12-15 DIAGNOSIS — F325 Major depressive disorder, single episode, in full remission: Secondary | ICD-10-CM | POA: Insufficient documentation

## 2019-12-15 DIAGNOSIS — E663 Overweight: Secondary | ICD-10-CM

## 2019-12-15 DIAGNOSIS — E559 Vitamin D deficiency, unspecified: Secondary | ICD-10-CM

## 2019-12-15 DIAGNOSIS — E782 Mixed hyperlipidemia: Secondary | ICD-10-CM

## 2019-12-15 DIAGNOSIS — Z79899 Other long term (current) drug therapy: Secondary | ICD-10-CM

## 2019-12-15 DIAGNOSIS — E890 Postprocedural hypothyroidism: Secondary | ICD-10-CM

## 2019-12-15 DIAGNOSIS — E669 Obesity, unspecified: Secondary | ICD-10-CM

## 2019-12-15 DIAGNOSIS — G4700 Insomnia, unspecified: Secondary | ICD-10-CM

## 2019-12-15 NOTE — Patient Instructions (Addendum)
Shawna Forbes , Thank you for taking time to come for your Annual Wellness Visit. I appreciate your ongoing commitment to your health goals. Please review the following plan we discussed and let me know if I can assist you in the future.   These are the goals we discussed: Goals    . Exercise 150 min/wk Moderate Activity    . LDL CALC < 100    . Weight (lb) < 150 lb (68 kg)       This is a list of the screening recommended for you and due dates:  Health Maintenance  Topic Date Due  . HIV Screening  12/26/2019*  . Pap Smear  10/16/2020  . Tetanus Vaccine  07/18/2023  . Flu Shot  Completed  .  Hepatitis C: One time screening is recommended by Center for Disease Control  (CDC) for  adults born from [birth year] through 1965.   Discontinued  *Topic was postponed. The date shown is not the original due date.      Know what a healthy weight is for you (roughly BMI <25) and aim to maintain this  Aim for 7+ servings of fruits and vegetables daily  65-80+ fluid ounces of water or unsweet tea for healthy kidneys  Limit to max 1 drink of alcohol per day; avoid smoking/tobacco  Limit animal fats in diet for cholesterol and heart health - choose grass fed whenever available  Avoid highly processed foods, and foods high in saturated/trans fats  Aim for low stress - take time to unwind and care for your mental health  Aim for 150 min of moderate intensity exercise weekly for heart health, and weights twice weekly for bone health  Aim for 7-9 hours of sleep daily    COLOGUARD INFORMATION   Cologuard is an easy to use noninvasive colon cancer screening test based on the latest advances in stool DNA science.   Colon cancer is 3rd most diagnosed cancer and 2nd leading cause of death in both men and women 25 years of age and older despite being one of the most preventable and treatable cancers if found early.  4 of out 5 people diagnosed with colon cancer have NO prior family history.   When caught EARLY 90% of colon cancer is curable.   More than 92% of cologuard patients have NO out of pocket cost for screening however only your insurer can confirm how Cologuard would be covered for you. [employer] has a team of specialist that can help you contact your insurer and ask the right questions. Please call [phone] so they can help.   You will receive a short call from [place] support center at [employer], when you receive a call they will say they are from [place],  to confirm your mailing address and give you more information.  When they calll you, it will appear on the caller ID as "[employer]" or in some cases only this number will appear, [phone].   [employer] will ship your collection kit directly to you. You will collect a single stool sample in the privacy of your own home, no special preparation required. You will return the kit via [place] pre-paid shipping or pick-up, in the same box it arrived in. Then I will contact you to discuss your results after I receive them from the laboratory.   If you have any questions or concerns, [employer] Customer Support Specialist are available 24 hours a day, 7 days a week at [phone]  or go to [email or web address].

## 2019-12-16 ENCOUNTER — Other Ambulatory Visit: Payer: Self-pay | Admitting: Adult Health

## 2019-12-16 ENCOUNTER — Encounter: Payer: Self-pay | Admitting: Adult Health

## 2019-12-16 DIAGNOSIS — N182 Chronic kidney disease, stage 2 (mild): Secondary | ICD-10-CM | POA: Insufficient documentation

## 2019-12-16 LAB — CBC WITH DIFFERENTIAL/PLATELET
Absolute Monocytes: 615 cells/uL (ref 200–950)
Basophils Absolute: 58 cells/uL (ref 0–200)
Basophils Relative: 1.1 %
Eosinophils Absolute: 212 cells/uL (ref 15–500)
Eosinophils Relative: 4 %
HCT: 39.4 % (ref 35.0–45.0)
Hemoglobin: 13.1 g/dL (ref 11.7–15.5)
Lymphs Abs: 1203 cells/uL (ref 850–3900)
MCH: 30.1 pg (ref 27.0–33.0)
MCHC: 33.2 g/dL (ref 32.0–36.0)
MCV: 90.6 fL (ref 80.0–100.0)
MPV: 12.3 fL (ref 7.5–12.5)
Monocytes Relative: 11.6 %
Neutro Abs: 3212 cells/uL (ref 1500–7800)
Neutrophils Relative %: 60.6 %
Platelets: 241 10*3/uL (ref 140–400)
RBC: 4.35 10*6/uL (ref 3.80–5.10)
RDW: 12.1 % (ref 11.0–15.0)
Total Lymphocyte: 22.7 %
WBC: 5.3 10*3/uL (ref 3.8–10.8)

## 2019-12-16 LAB — COMPLETE METABOLIC PANEL WITH GFR
AG Ratio: 1.5 (calc) (ref 1.0–2.5)
ALT: 17 U/L (ref 6–29)
AST: 15 U/L (ref 10–35)
Albumin: 4.4 g/dL (ref 3.6–5.1)
Alkaline phosphatase (APISO): 37 U/L (ref 31–125)
BUN: 15 mg/dL (ref 7–25)
CO2: 30 mmol/L (ref 20–32)
Calcium: 10 mg/dL (ref 8.6–10.2)
Chloride: 105 mmol/L (ref 98–110)
Creat: 0.86 mg/dL (ref 0.50–1.10)
GFR, Est African American: 93 mL/min/{1.73_m2} (ref >=60)
GFR, Est Non African American: 80 mL/min/{1.73_m2} (ref >=60)
Globulin: 3 g/dL (calc) (ref 1.9–3.7)
Glucose, Bld: 87 mg/dL (ref 65–99)
Potassium: 4.5 mmol/L (ref 3.5–5.3)
Sodium: 142 mmol/L (ref 135–146)
Total Bilirubin: 0.3 mg/dL (ref 0.2–1.2)
Total Protein: 7.4 g/dL (ref 6.1–8.1)

## 2019-12-16 LAB — LIPID PANEL
Cholesterol: 186 mg/dL (ref <200)
HDL: 68 mg/dL (ref >=50)
LDL Cholesterol (Calc): 97 mg/dL (calc)
Non-HDL Cholesterol (Calc): 118 mg/dL (calc) (ref <130)
Total CHOL/HDL Ratio: 2.7 (calc) (ref <5.0)
Triglycerides: 114 mg/dL (ref <150)

## 2019-12-16 LAB — URINALYSIS, ROUTINE W REFLEX MICROSCOPIC
Bilirubin Urine: NEGATIVE
Glucose, UA: NEGATIVE
Hgb urine dipstick: NEGATIVE
Ketones, ur: NEGATIVE
Leukocytes,Ua: NEGATIVE
Nitrite: NEGATIVE
Protein, ur: NEGATIVE
Specific Gravity, Urine: 1.022 (ref 1.001–1.03)
pH: 5.5 (ref 5.0–8.0)

## 2019-12-16 LAB — TSH: TSH: 1.84 mIU/L

## 2019-12-16 LAB — VITAMIN D 25 HYDROXY (VIT D DEFICIENCY, FRACTURES): Vit D, 25-Hydroxy: 77 ng/mL (ref 30–100)

## 2019-12-16 LAB — MAGNESIUM: Magnesium: 2.2 mg/dL (ref 1.5–2.5)

## 2019-12-16 MED ORDER — VITAMIN D (CHOLECALCIFEROL) 25 MCG (1000 UT) PO TABS: 5000.0000 [IU] | ORAL_TABLET | Freq: Every day | ORAL | Status: AC

## 2019-12-26 ENCOUNTER — Ambulatory Visit (INDEPENDENT_AMBULATORY_CARE_PROVIDER_SITE_OTHER): Payer: 59 | Admitting: *Deleted

## 2019-12-26 ENCOUNTER — Other Ambulatory Visit: Payer: Self-pay

## 2019-12-26 DIAGNOSIS — B351 Tinea unguium: Secondary | ICD-10-CM

## 2019-12-26 NOTE — Progress Notes (Signed)
Patient presents today for the 4th laser treatment. Diagnosed with mycotic nail infection by Dr. Charlsie Merles.   Most of the nails are affected. The hallux left has quite a bit of new nail growth. The hallux right is showing very little improvement.  All other systems are negative.  Nails were filed thin. Laser therapy was administered to 1-5 toenails bilateral and patient tolerated the treatment well. All safety precautions were in place.   Patient has finished pulse dosing of lamisil.    Follow up in 6 weeks for laser #5.   We initially only planned for 4 treatments, but nails are looking as good as we had hoped, so will continue out all 6 treatments, then have Dr. Charlsie Merles evaluate in 1 month after completion.

## 2020-01-27 ENCOUNTER — Other Ambulatory Visit: Payer: Self-pay | Admitting: Internal Medicine

## 2020-01-27 DIAGNOSIS — F331 Major depressive disorder, recurrent, moderate: Secondary | ICD-10-CM

## 2020-02-06 ENCOUNTER — Ambulatory Visit (INDEPENDENT_AMBULATORY_CARE_PROVIDER_SITE_OTHER): Payer: 59 | Admitting: *Deleted

## 2020-02-06 ENCOUNTER — Other Ambulatory Visit: Payer: Self-pay

## 2020-02-06 DIAGNOSIS — B351 Tinea unguium: Secondary | ICD-10-CM

## 2020-02-06 NOTE — Progress Notes (Signed)
Patient presents today for the 5th laser treatment. Diagnosed with mycotic nail infection by Dr. Charlsie Merles.   Most of the nails are affected. The nails have shown very little improvement, the fungus is more superficial.   All other systems are negative.  Nails were filed thin. Laser therapy was administered to 1-5 toenails bilateral and patient tolerated the treatment well. All safety precautions were in place.   Patient has finished pulse dosing of lamisil.    Follow up in 8 weeks for laser #6.   We initially only planned for 4 treatments, but nails are looking as good as we had hoped, so will continue out all 6 treatments, then have Dr. Charlsie Merles evaluate in 1 month after completion.    ~Take final nail pics next visit~

## 2020-03-26 ENCOUNTER — Other Ambulatory Visit: Payer: Self-pay

## 2020-03-26 ENCOUNTER — Ambulatory Visit (INDEPENDENT_AMBULATORY_CARE_PROVIDER_SITE_OTHER): Payer: 59

## 2020-03-26 DIAGNOSIS — B351 Tinea unguium: Secondary | ICD-10-CM

## 2020-03-26 NOTE — Progress Notes (Signed)
Patient presents today for the 6th laser treatment. Diagnosed with mycotic nail infection by Dr. Charlsie Merles.   Most of the nails are affected. The nails have shown very little improvement, the fungus is more superficial.   All other systems are negative.  Nails were filed thin. Laser therapy was administered to 1-5 toenails bilateral and patient tolerated the treatment well. All safety precautions were in place.   Patient has finished pulse dosing of lamisil.   Patient has completed the recommended laser treatments. He will follow up with Dr. Charlsie Merles  in 1 months to evaluate progress.

## 2020-04-23 ENCOUNTER — Ambulatory Visit: Payer: 59 | Admitting: Podiatry

## 2020-04-29 ENCOUNTER — Ambulatory Visit (INDEPENDENT_AMBULATORY_CARE_PROVIDER_SITE_OTHER): Payer: 59 | Admitting: Podiatry

## 2020-04-29 ENCOUNTER — Encounter: Payer: Self-pay | Admitting: Podiatry

## 2020-04-29 ENCOUNTER — Other Ambulatory Visit: Payer: Self-pay

## 2020-04-29 DIAGNOSIS — B351 Tinea unguium: Secondary | ICD-10-CM

## 2020-04-29 MED ORDER — TERBINAFINE HCL 250 MG PO TABS: 250.0000 mg | ORAL_TABLET | Freq: Every day | ORAL | 0 refills | Status: DC

## 2020-05-02 NOTE — Progress Notes (Signed)
Subjective:   Patient ID: Shawna Forbes, female   DOB: 50 y.o.   MRN: [id number]   HPI Patient presents stating that she still is having problems with her nails and thinks she may have had some improvement but continues to have a lot of discoloration and she would like to try to do something   ROS      Objective:  Physical Exam  Neuro vascular status intact with patient found to have distal discoloration of her nailbeds especially around the hallux distal two thirds of the bed localized with moderate improvement occurring in adjacent nails     Assessment:  Probability for continuation of mycotic infection with patient stating she did not take weekly the pulse dose we had given her     Plan:  Reviewed condition discussed treatment options she would like to try anything and I recommended we do a course of Lamisil but shorter of 45 days and she has had liver function studies which were normal and patient is explained risk.  We will only do 45 days 1 pill/day and if this does not work I do not recommend further treatment in future and she can be seen back as needed

## 2020-06-06 IMAGING — US US THYROID
1 series · 13 of 25 positions shown · non-contrast
Comparison: 08/06/2012, 09/08/2015

CLINICAL DATA: Dominant right thyroid nodule, previously biopsied
08/06/2012

EXAM:
THYROID ULTRASOUND
TECHNIQUE: Ultrasound examination of the thyroid gland and adjacent soft
tissues was performed.

[Series 1: us thyroid · 0.06mm/px · 13 of 45 slices shown]
[im 1/45]
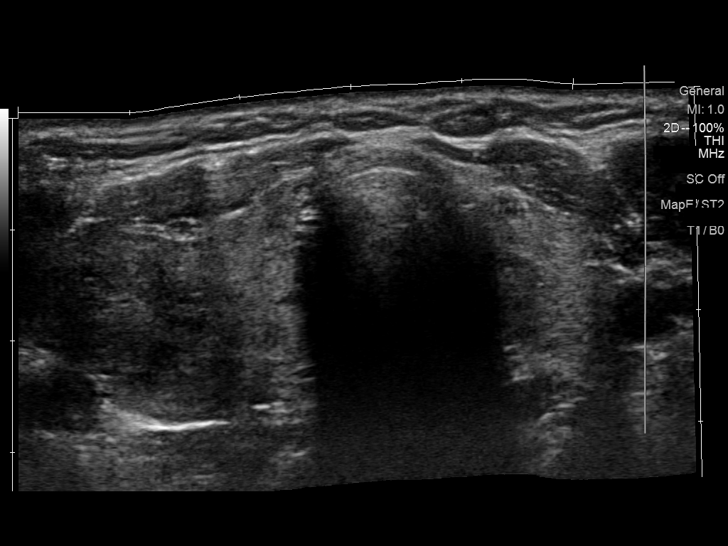
[im 4/45]
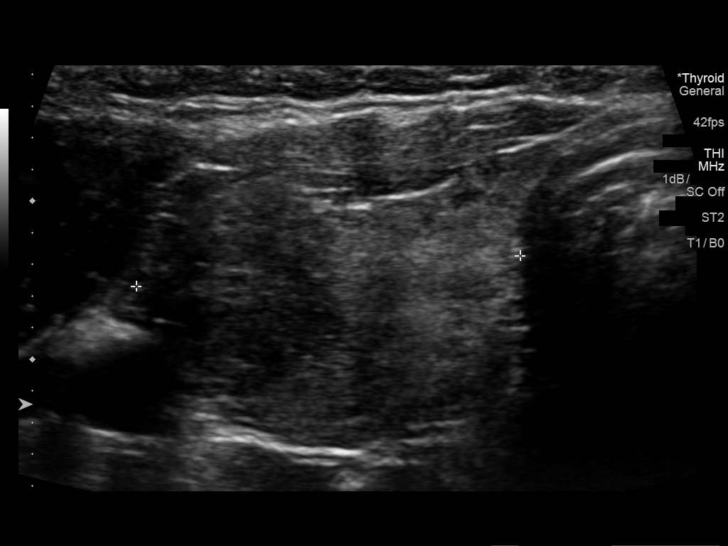
[im 8/45]
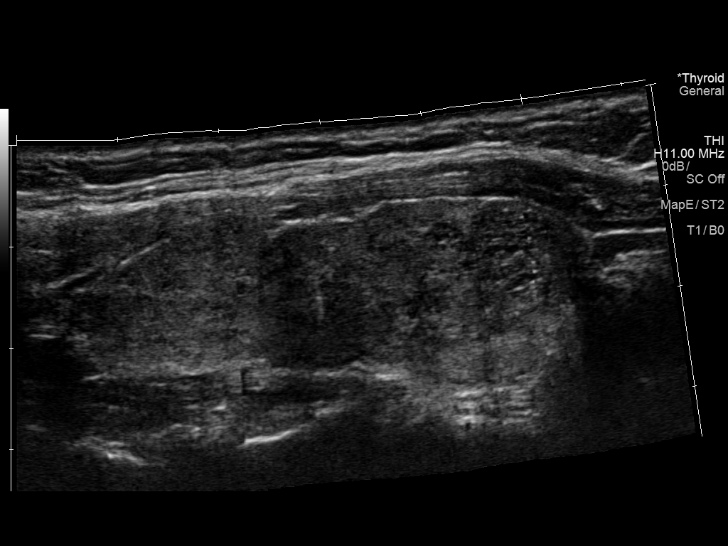
[im 12/45]
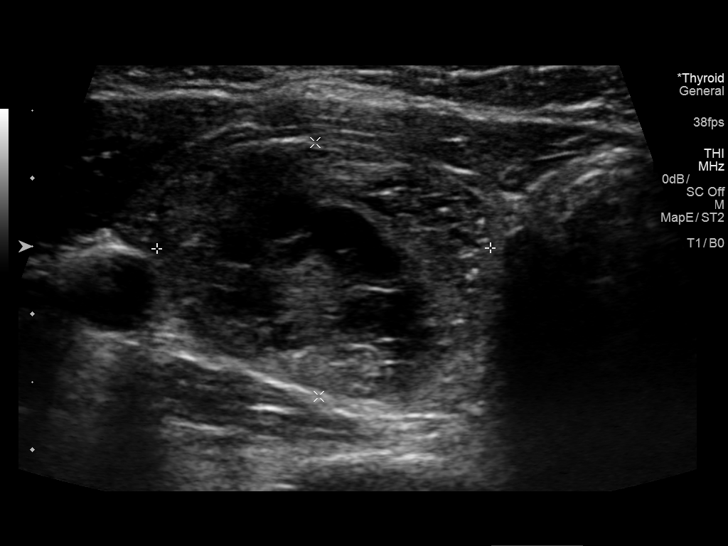
[im 15/45]
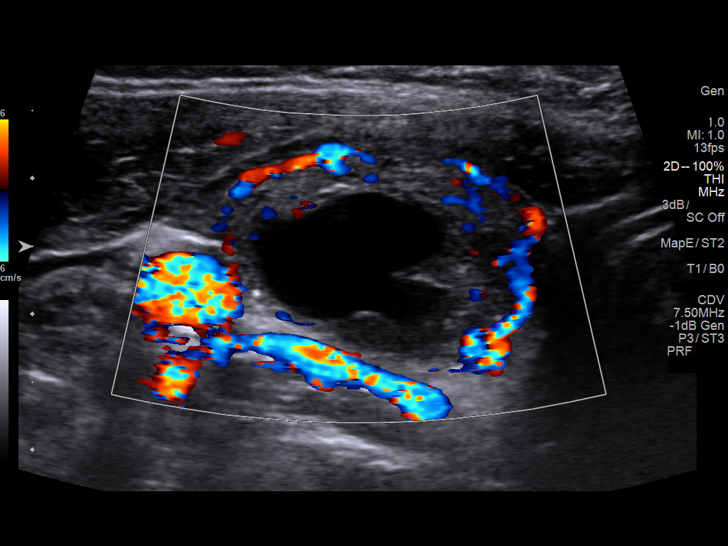
[im 19/45]
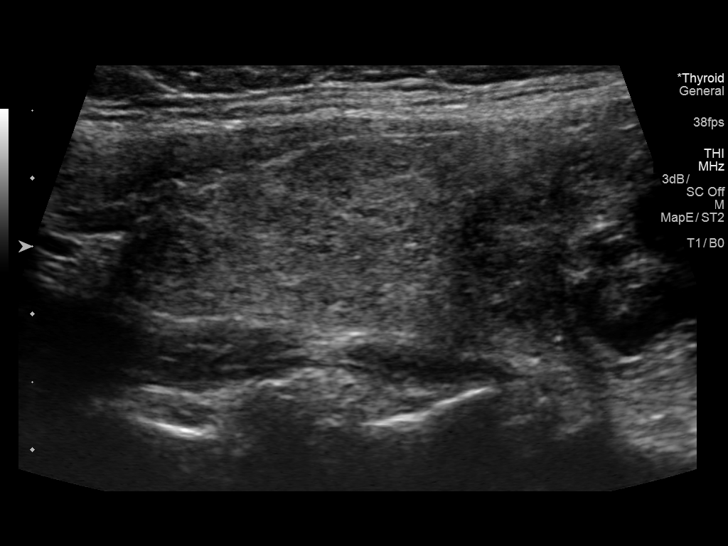
[im 23/45]
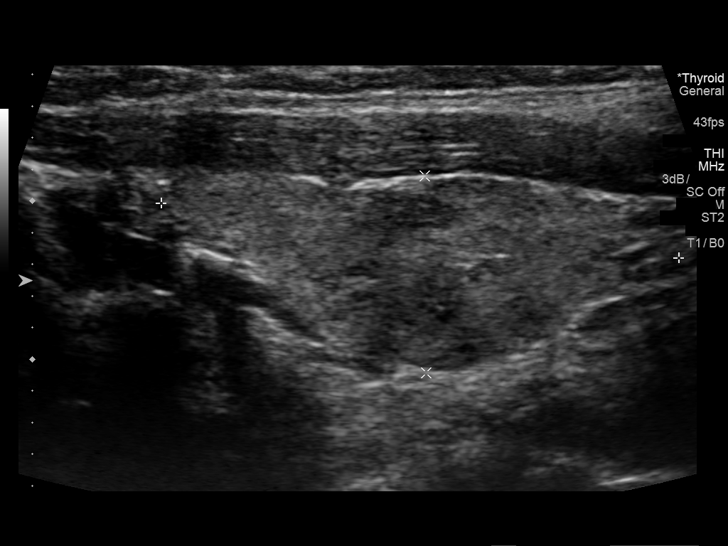
[im 26/45]
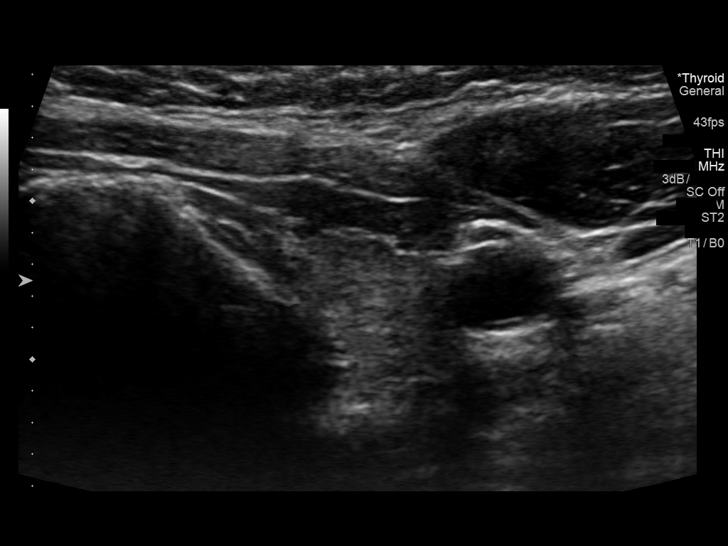
[im 30/45]
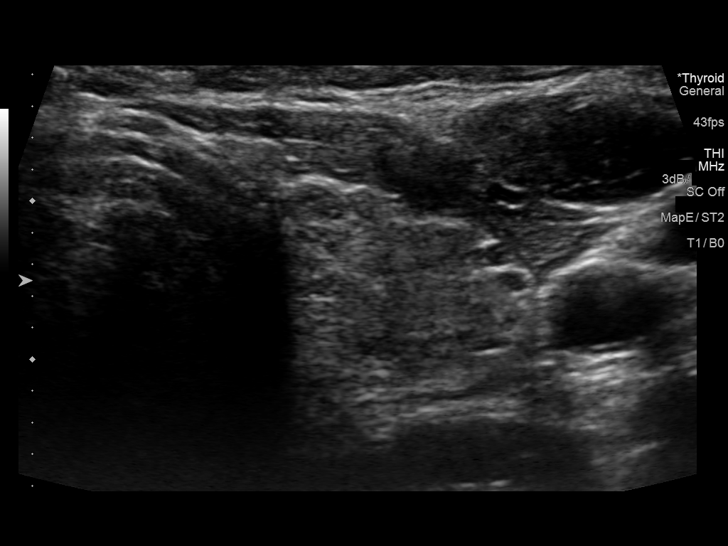
[im 34/45]
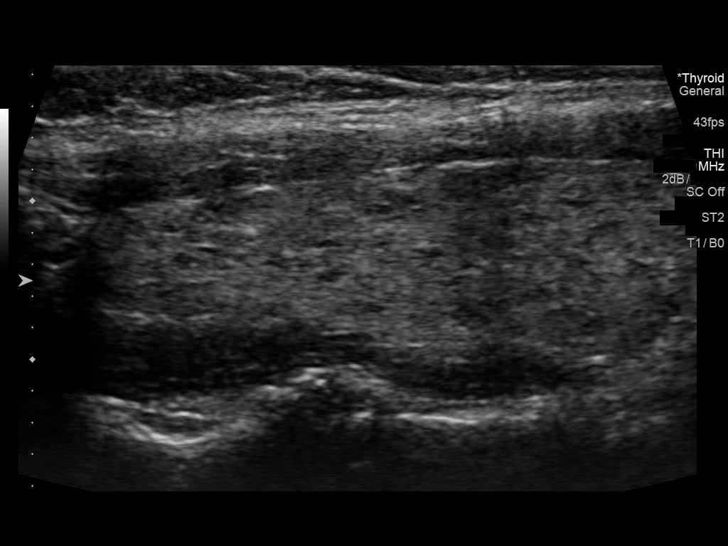
[im 37/45]
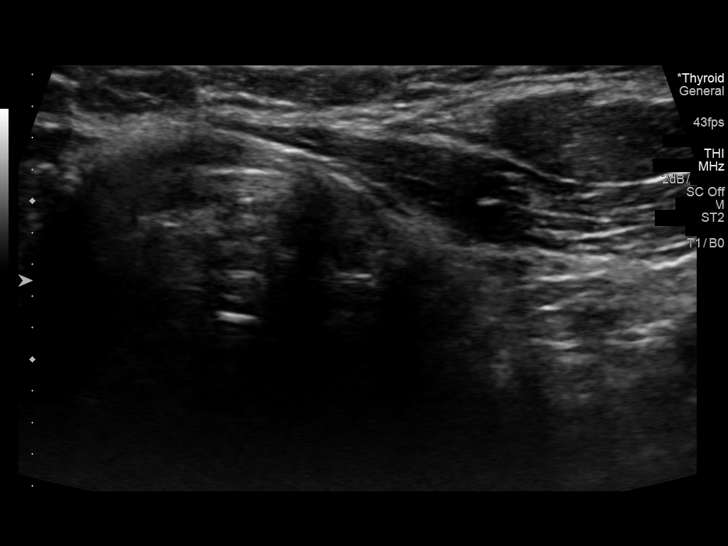
[im 41/45]
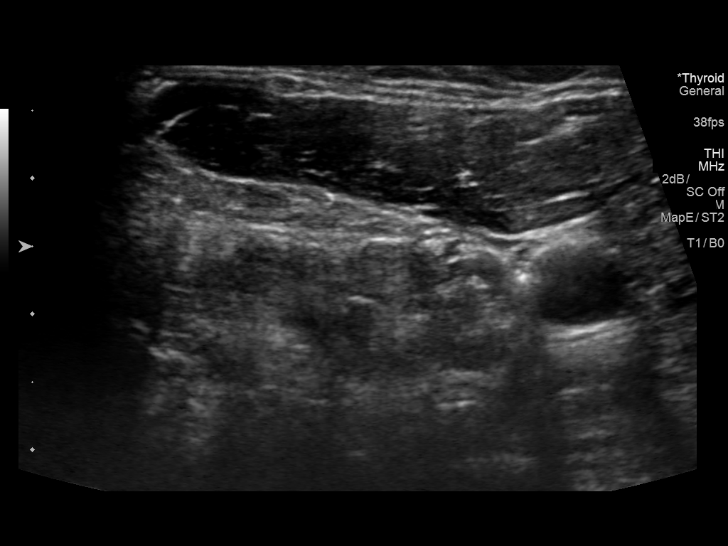
[im 45/45]
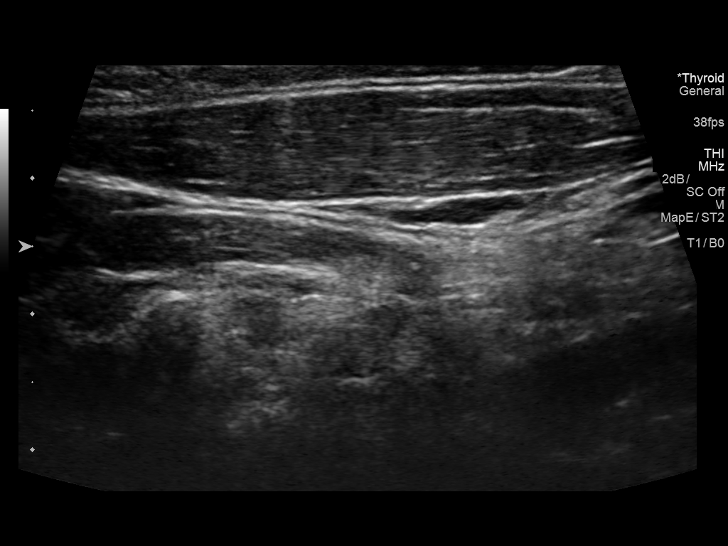

[13 of 25 positions shown; findings below may reference images not displayed]

FINDINGS: Parenchymal Echotexture: Moderately heterogenous

Isthmus: 3 mm

Right lobe: 5.4 x 1.7 x 2.4 cm, previously 5.6 x 2.1 x 2.2 cm

Left lobe: 3.2 x 1.2 x 1.4 cm, previously 4.6 x 1.4 x 1.6 cm

_________________________________________________________

Estimated total number of nodules >/= 1 cm: 1

Number of spongiform nodules >/=  2 cm not described below (TR1): 0

Number of mixed cystic and solid nodules >/= 1.5 cm not described
below (TR2): 0

_________________________________________________________

The previously biopsied mixed cystic/solid isoechoic nodule measures
3.7 x 2.5 x 1.9 cm, previously 5.6 x 2.1 x 1.7 cm. Correlate with
prior pathology.

Stable moderate gland heterogeneity with scattered additional
isoechoic subcentimeter nodules measuring 4 mm or less in size.

No new thyroid abnormality.  Normal vascularity.  No adenopathy.
IMPRESSION: Dominant mixed cystic solid isoechoic right thyroid nodule is
smaller, greatest diameter 3.7 cm. Correlate with prior pathology.

Stable moderate gland heterogeneity with no significant interval
change or new abnormality.

The above is in keeping with the ACR TI-RADS recommendations - [HOSPITAL] 0967;[DATE].

## 2020-06-27 ENCOUNTER — Other Ambulatory Visit: Payer: Self-pay | Admitting: Podiatry

## 2020-06-27 ENCOUNTER — Other Ambulatory Visit: Payer: Self-pay | Admitting: Adult Health

## 2020-06-27 DIAGNOSIS — F331 Major depressive disorder, recurrent, moderate: Secondary | ICD-10-CM

## 2020-06-29 NOTE — Telephone Encounter (Signed)
Should not be refilled at this time

## 2020-06-29 NOTE — Telephone Encounter (Signed)
Please advise 

## 2020-12-14 DIAGNOSIS — E538 Deficiency of other specified B group vitamins: Secondary | ICD-10-CM | POA: Insufficient documentation

## 2020-12-14 NOTE — Progress Notes (Signed)
Complete Physical  Assessment and Plan:  Diagnoses and all orders for this visit:  Encounter for Annual Physical Exam with abnormal findings Due annually  Health Maintenance reviewed Healthy lifestyle reviewed and goals set  Continue follow up with GYN, reports requested   Gastroesophageal reflux disease without esophagitis Well managed by lifestyle; uses famotidine  -     Magnesium  Right thyroid nodule/ thyroid S/p ablation followed by endocrinology  Check TSH Synthroid managed by endocrine Will request reports  Mixed hyperlipidemia Continue low cholesterol diet, red rice yeast supplement Low risk family history Strong preference to avoid medications LDL goal to maintain <130 Consider cardio IQ or coronary calcium if trending up  -     TSH -     Lipid panel  Vitamin D deficiency Continue supplementation -     VITAMIN D 25 Hydroxy (Vit-D Deficiency, Fractures)  Insomnia        -      Well managed by nightly trazodone       -      Diet/exercise, sleep hygiene discussed  Overweight - BMI ? (Declined weight/BMI today) Encouraged healthy lifestyle  Recommended diet heavy in fruits and veggies and low in animal meats, cheeses, and dairy products, appropriate calorie intake Defer weight discussion - body dysmorphia Continue with current lifestyle changes, discussed low processed high fiber diet, Follow up at next visit  Seasonal depression ([place]) Add wellbutrin 150 mg XR during winter months Increase vitamin D during winter Lifestyle discussed: diet/exerise, sleep hygiene, stress management, hydration  Screening for diabetes mellitus -     A1C  Screening for hematuria or proteinuria -     Urinalysis, Complete (81001)  Medication management -     CBC with Differential/Platelet -     CMP/GFR -     Urinalysis, Complete (81001)  Screening colon cancer - GI referral placed  Orders Placed This Encounter  Procedures   Flu Vaccine QUAD 6+ mos PF IM (Fluarix Quad  PF)   CBC with Differential/Platelet   COMPLETE METABOLIC PANEL WITH GFR   Magnesium   Lipid panel   TSH   Hemoglobin A1c   VITAMIN D 25 Hydroxy (Vit-D Deficiency, Fractures)   Urinalysis, Routine w reflex microscopic   Microalbumin / creatinine urine ratio   Ambulatory referral to Gastroenterology     Discussed med's effects and SE's. Screening labs and tests as requested with regular follow-up as recommended. Over 40 minutes of exam, counseling, chart review, and complex, high level critical decision making was performed this visit.   Future Appointments  Date Time Provider [place]  12/15/2021  9:00 AM [name], NP [facility] None    HPI  50 y.o. Caucasian female,  presents for a complete physical and follow up for has Mixed hyperlipidemia; Vitamin D deficiency; GERD (gastroesophageal reflux disease); Insomnia; Overweight (BMI 25.0-29.9); Hypothyroid; Major depression in remission ([place]); CKD (chronic kidney disease) stage 2, GFR 60-89 ml/min; B12 deficiency; Onychomycosis; and Seasonal depression ([place]) on their problem list.   Married, 2 kids, works as Designer, television/film set working hybrid, had promotion. Recovering from cold 3 weeks ago, just residual post - nasal drip. On allergy meds.   She sees GYN Dr. Corinna Forbes annually for PAPs and 3D mammogram. No issues/concerns today. On Mirena.   She reports stress levels are significantly improved; she is seeing a counselor monthly; She has a blended family, adult autistic son. She continues with trazodone nightly which helps he fall back asleep and has significantly improved quality  of life. She would like to restart wellbutrin for seasonal depression, also increases vit D to 5000 IU during the winter.   She is now seeing podiatrist for onychomychosis regularly, having laser treatments and doing lamisil and improved. Planning to repeat lamisil at later time.   BMI is Body mass index is 28.22 kg/m., NOT TAKEN TODAY, DECLINED;  reports body dysmorphia, working with therapist.  She has been working on diet/exercise, was down from 199 lb on home scale in 03/2019 to 167 lb last year, has regained. Doing lower starch, high fiber. She has regular exercise program. Feels good. Lots of veggies, trying to limit processed. Rare alcohol.  Wt Readings from Last 3 Encounters:  12/15/19 167 lb (75.8 kg)  12/09/18 199 lb 9.6 oz (90.5 kg)  11/29/17 185 lb (83.9 kg)   Today their BP is BP: 124/82 She does workout. She denies chest pain, shortness of breath, dizziness.   She is not on cholesterol medication (on red yeast rice supplement) and denies myalgias. Her cholesterol is at goal. She has been working on lifestyle to avoid cholesterol medication. Denies family history of MI/CVA until grandfather in his 52s.  The cholesterol last visit was:   Lab Results  Component Value Date   CHOL 186 12/15/2019   HDL 68 12/15/2019   LDLCALC 97 12/15/2019   TRIG 114 12/15/2019   CHOLHDL 2.7 12/15/2019    Last A1C in the office was:  Lab Results  Component Value Date   HGBA1C 5.1 12/09/2018    Patient with hx of thyroid nodule, mild hyperthyroid, was referred to Dr. Reynold Forbes and underwent uptake study and ablation per his recommendations and now on synthroid, taking 100 mcg. She is getting levels checked q78m Lab Results  Component Value Date   TSH 1.84 12/15/2019   Last GFR: Lab Results  Component Value Date   GFRNONAA 80 12/15/2019   Patient is on Vitamin D supplement and below goal at the last visit, taking 5000 IU daily:    Lab Results  Component Value Date   VD25OH 77 12/15/2019     She is on B12 supplement;  Lab Results  Component Value Date   VITAMINB12 1,186 (H) 12/09/2018     Current Medications:  Current Outpatient Medications on File Prior to Visit  Medication Sig Dispense Refill   Ascorbic Acid (VITAMIN C) 1000 MG tablet Take 1,000 mg by mouth daily.     aspirin 81 MG tablet Take 81 mg by mouth daily.      calcium carbonate (OS-CAL) 600 MG TABS tablet Take 600 mg by mouth 2 (two) times daily with a meal.     eletriptan (RELPAX) 40 MG tablet TAKE 1 TABLET BY MOUTH AS NEEDED FOR MIGRAINE OR HEADACHE MAY REPEAT IN 2 HOURS IF NEEDED 10 tablet 5   fluticasone (FLONASE) 50 MCG/ACT nasal spray Place into both nostrils daily.     levonorgestrel (MIRENA, 52 MG,) 20 MCG/24HR IUD Mirena 20 mcg/24 hours (7 yrs) 52 mg intrauterine device  Take 1 device by intrauterine route.     levothyroxine (SYNTHROID) 100 MCG tablet Take 100 mcg by mouth daily before breakfast. 112 mcg 6 days a week and a pill and a half 1 day a week     Magnesium 250 MG TABS Take by mouth.     meclizine (ANTIVERT) 25 MG tablet Take 1 tablet (25 mg total) by mouth 3 (three) times daily as needed for dizziness. 30 tablet 1   Multiple Vitamins-Minerals (MULTIVITAMIN  WITH MINERALS) tablet Take 1 tablet by mouth. Three times per week     OVER THE COUNTER MEDICATION 1 tablet daily. [employer] brand allergy pill     Red Yeast Rice 600 MG CAPS Take 600 mg by mouth 2 (two) times daily.     Turmeric (QC TUMERIC COMPLEX PO) Take 250 mg by mouth.     Vitamin D, Cholecalciferol, 25 MCG (1000 UT) TABS Take 5,000 Units by mouth daily. 60 tablet    Zinc 50 MG TABS Take by mouth daily.     terbinafine (LAMISIL) 250 MG tablet Please take one a day x 7days, repeat every 4 weeks x 4 months (Patient not taking: Reported on 12/15/2020) 28 tablet 0   terbinafine (LAMISIL) 250 MG tablet Take 1 tablet (250 mg total) by mouth daily. (Patient not taking: Reported on 12/15/2020) 45 tablet 0   traZODone (DESYREL) 150 MG tablet Take  1 tablet  1 hour  before Bedtime  as needed for Sleep 90 tablet 1   No current facility-administered medications on file prior to visit.   Allergies:  Allergies  Allergen Reactions   Tetracyclines & Related     Hives   Thimerosal Other (See Comments)    Son with autism and has been informed to never have vaccine that contains thimersal     Medical History:  She has Mixed hyperlipidemia; Vitamin D deficiency; GERD (gastroesophageal reflux disease); Insomnia; Overweight (BMI 25.0-29.9); Hypothyroid; Major depression in remission ([facility]); CKD (chronic kidney disease) stage 2, GFR 60-89 ml/min; B12 deficiency; Onychomycosis; and Seasonal depression ([facility]) on their problem list.  Health Maintenance:   Immunization History  Administered Date(s) Administered   Influenza Inj Mdck Quad Pf 11/06/2018   Influenza,inj,Quad PF,6+ Mos 11/22/2017, 12/15/2020   Influenza-Unspecified 11/23/2016, 11/17/2019   Moderna Sars-Covid-2 Vaccination 04/25/2019, 05/23/2019   PFIZER(Purple Top)SARS-COV-2 Vaccination 12/13/2019   PPD Test 11/20/2013   Td 01/17/2004   Tdap 07/17/2013   Tetanus: 2015 Flu vaccine: fall 2021 at pharmacy  Pneumonia: low risk  Shingrix: next year, check with insurance  Covid 19: 2/2 + booster  LMP: No LMP recorded. (Menstrual status: IUD). Pap/pelvic: 10/2020 Pap neg per patient, Sees GYN annually  MGM: 10/2020 in Dr. Vance Gather office  - report requested   Colonoscopy: - never did cologuard, asks for colonoscopy referral  EGD: 2016  Last Dental Exam: goes q 8m, last 09/2020, Dr. Esau Grew Last Eye Exam: Annually - last [id number], Dr. Sharlot Gowda - [facility] - no issues Last Skin Exam: a few years back with Dr. Emily Filbert - will schedule follow up  Patient Care Team: Lucky Cowboy, MD as PCP - General (Internal Medicine) Candice Camp, MD as Consulting Physician (Obstetrics and Gynecology) Amelia Jo, MD as Consulting Physician (Neurology) Elmon Else, MD as Consulting Physician (Dermatology)  Surgical History:  She has a past surgical history that includes Gynecologic cryosurgery; Mole removal; and Wisdom tooth extraction (1989). Family History:  Herfamily history includes Bipolar disorder in her daughter; Cancer in her maternal grandfather; Colon polyps in her mother; Dementia in her maternal grandmother; Diabetes in  an other family member; Heart disease in her maternal grandfather; Hyperlipidemia in her mother; Hypertension in her mother; Hypothyroidism in her father and mother; Irritable bowel syndrome in her mother; Osteopenia in her paternal grandmother; Stroke in her paternal grandfather. Social History:  She reports that she has never smoked. She has never used smokeless tobacco. She reports current alcohol use. She reports that she does not use drugs.  Review of  Systems: Review of Systems  Constitutional:  Negative for malaise/fatigue and weight loss.  HENT:  Negative for hearing loss and tinnitus.   Eyes:  Negative for blurred vision and double vision.  Respiratory:  Negative for cough, shortness of breath and wheezing.   Cardiovascular:  Negative for chest pain, palpitations, orthopnea, claudication and leg swelling.  Gastrointestinal:  Negative for abdominal pain, blood in stool, constipation, diarrhea, heartburn, melena, nausea and vomiting.  Genitourinary: Negative.   Musculoskeletal:  Negative for joint pain and myalgias.  Skin:  Negative for rash.  Neurological:  Negative for dizziness, tingling, sensory change, weakness and headaches.  Endo/Heme/Allergies:  Positive for environmental allergies. Negative for polydipsia.  Psychiatric/Behavioral:  Positive for depression. Negative for substance abuse and suicidal ideas. The patient has insomnia. The patient is not nervous/anxious.   All other systems reviewed and are negative.  Physical Exam: Estimated body mass index is 28.22 kg/m as calculated from the following:   Height as of this encounter: 5' 4.5" (1.638 m).   Weight as of 12/15/19: 167 lb (75.8 kg). BP 124/82   Pulse 89   Temp (!) 97.5 F (36.4 C)   Ht 5' 4.5" (1.638 m)   SpO2 98%   BMI 28.22 kg/m  General Appearance: Well nourished, in no apparent distress.  Eyes: PERRLA, EOMs, conjunctiva no swelling or erythema Sinuses: No Frontal/maxillary tenderness  ENT/Mouth: Ext aud  canals clear, normal light reflex with TMs without erythema, bulging. Good dentition. No erythema, swelling, or exudate on post pharynx. Tonsils not swollen or erythematous. Hearing normal.  Neck: Supple, thyroid normal. No bruits  Respiratory: Respiratory effort normal, BS equal bilaterally without rales, rhonchi, wheezing or stridor.  Cardio: RRR without murmurs, rubs or gallops. Brisk peripheral pulses without edema.  Chest: symmetric, with normal excursions and percussion.  Breasts: Defer to GYN Abdomen: Soft, nontender, no guarding, rebound, hernias, masses, or organomegaly.  Lymphatics: Non tender without lymphadenopathy.  Genitourinary: Defer to GYN Musculoskeletal: Full ROM all peripheral extremities,5/5 strength, and normal gait.  Skin: Warm, dry without rashes, ecchymosis. She has approx 8 mm brown mildly raised nevi to R lower back (has seen derm for this). Bil toenails with irregular texture distally, normal growth at base Neuro: Cranial nerves intact, reflexes equal bilaterally. Normal muscle tone, no cerebellar symptoms. Sensation intact.  Psych: Awake and oriented X 3, normal affect, Insight and Judgment appropriate.   EKG: defer this visit, had 11/2019    Shawna Forbes 1:17 PM [facility]

## 2020-12-15 ENCOUNTER — Ambulatory Visit (INDEPENDENT_AMBULATORY_CARE_PROVIDER_SITE_OTHER): Payer: 59 | Admitting: Adult Health

## 2020-12-15 ENCOUNTER — Other Ambulatory Visit: Payer: Self-pay

## 2020-12-15 ENCOUNTER — Encounter: Payer: Self-pay | Admitting: Adult Health

## 2020-12-15 VITALS — BP 124/82 | HR 89 | Temp 97.5°F | Ht 64.5 in

## 2020-12-15 DIAGNOSIS — F338 Other recurrent depressive disorders: Secondary | ICD-10-CM | POA: Insufficient documentation

## 2020-12-15 DIAGNOSIS — F325 Major depressive disorder, single episode, in full remission: Secondary | ICD-10-CM

## 2020-12-15 DIAGNOSIS — E559 Vitamin D deficiency, unspecified: Secondary | ICD-10-CM

## 2020-12-15 DIAGNOSIS — Z79899 Other long term (current) drug therapy: Secondary | ICD-10-CM

## 2020-12-15 DIAGNOSIS — Z130 Encounter for screening for diseases of the blood and blood-forming organs and certain disorders involving the immune mechanism: Secondary | ICD-10-CM

## 2020-12-15 DIAGNOSIS — Z131 Encounter for screening for diabetes mellitus: Secondary | ICD-10-CM

## 2020-12-15 DIAGNOSIS — Z5320 Procedure and treatment not carried out because of patient's decision for unspecified reasons: Secondary | ICD-10-CM

## 2020-12-15 DIAGNOSIS — E538 Deficiency of other specified B group vitamins: Secondary | ICD-10-CM

## 2020-12-15 DIAGNOSIS — Z1211 Encounter for screening for malignant neoplasm of colon: Secondary | ICD-10-CM

## 2020-12-15 DIAGNOSIS — Z0000 Encounter for general adult medical examination without abnormal findings: Secondary | ICD-10-CM

## 2020-12-15 DIAGNOSIS — Z23 Encounter for immunization: Secondary | ICD-10-CM | POA: Diagnosis not present

## 2020-12-15 DIAGNOSIS — E890 Postprocedural hypothyroidism: Secondary | ICD-10-CM

## 2020-12-15 DIAGNOSIS — R635 Abnormal weight gain: Secondary | ICD-10-CM

## 2020-12-15 DIAGNOSIS — G4700 Insomnia, unspecified: Secondary | ICD-10-CM

## 2020-12-15 DIAGNOSIS — B351 Tinea unguium: Secondary | ICD-10-CM | POA: Insufficient documentation

## 2020-12-15 DIAGNOSIS — K219 Gastro-esophageal reflux disease without esophagitis: Secondary | ICD-10-CM

## 2020-12-15 DIAGNOSIS — E663 Overweight: Secondary | ICD-10-CM

## 2020-12-15 DIAGNOSIS — E782 Mixed hyperlipidemia: Secondary | ICD-10-CM

## 2020-12-15 DIAGNOSIS — N182 Chronic kidney disease, stage 2 (mild): Secondary | ICD-10-CM

## 2020-12-15 MED ORDER — BUPROPION HCL ER (XL) 150 MG PO TB24: 150.0000 mg | ORAL_TABLET | ORAL | 1 refills | Status: DC

## 2020-12-15 NOTE — Patient Instructions (Addendum)
[name] , Thank you for taking time to come for your Annual Wellness Visit. I appreciate your ongoing commitment to your health goals. Please review the following plan we discussed and let me know if I can assist you in the future.   These are the goals we discussed:  Goals      LDL CALC < 100        This is a list of the screening recommended for you and due dates:  Health Maintenance  Topic Date Due   COVID-19 Vaccine (4 - Booster) 02/07/2020   Flu Shot  08/16/2020   Pneumococcal Vaccination (1 - PCV) 12/16/2035*   Tetanus Vaccine  07/18/2023   Pap Smear  10/31/2023   HPV Vaccine  Aged Out   Hepatitis C Screening: USPSTF Recommendation to screen - Ages 18-79 yo.  Discontinued   HIV Screening  Discontinued  *Topic was postponed. The date shown is not the original due date.    Aim for 5-7+ servings of fruits and vegetables daily  Beans, whole grains, nuts/seeds  65-80+ fluid ounces of water or unsweet tea for healthy kidneys  Limit to max 1 drink of alcohol per day; avoid smoking/tobacco  Limit animal fats in diet for cholesterol and heart health - choose grass fed whenever available  Avoid highly processed foods, and foods high in saturated/trans fats  Aim for low stress - take time to unwind and care for your mental health  Aim for 150 min of moderate intensity exercise weekly for heart health, and weights twice weekly for bone health  Aim for 7-9 hours of sleep daily     Antiinflammatory diet  Doctors are learning that one of the best ways to reduce inflammation lies not in the medicine cabinet, but in the refrigerator.  By following an anti-inflammatory diet you can fight off inflammation for good.  What does an anti-inflammatory diet do? Your immune system becomes activated when your body recognizes anything that is foreign--such as an invading microbe, plant pollen, or chemical. This often triggers a process called inflammation. Intermittent bouts  of inflammation directed at truly threatening invaders protect your health.  However, sometimes inflammation persists, day in and day out, even when you are not threatened by a foreign invader. That's when inflammation can become your enemy. Many major diseases that plague us--including cancer, heart disease, diabetes, arthritis, depression, and Alzheimer's--have been linked to chronic inflammation.  One of the most powerful tools to combat inflammation comes not from the pharmacy, but from the grocery store.   Protect yourself from the damage of chronic inflammation. Science has proven that chronic, low-grade inflammation can turn into a silent killer that contributes to cardiovascular disease, cancer, type 2 diabetes and other conditions.   Choose the right anti-inflammatory foods, and you may be able to reduce your risk of illness. Consistently pick the wrong ones, and you could accelerate the inflammatory disease process.     Foods that cause inflammation Try to avoid or limit these foods as much as possible:  refined carbohydrates, such as white bread and pastries [place] fries and other fried foods soda and other sugar-sweetened beverages red meat (burgers, steaks) and processed meat (hot dogs, sausage) margarine, shortening, and lard  The health risks of inflammatory foods Not surprisingly, the same foods on an inflammation diet are generally considered bad for our health, including sodas and refined carbohydrates, as well as red meat and processed meats.  "Some of the foods that have been associated with an increased  risk for chronic diseases such as type 2 diabetes and heart disease are also associated with excess inflammation," Dr. Si Gaul says. "It's not surprising, since inflammation is an important underlying mechanism for the development of these diseases."  Unhealthy foods also contribute to weight gain, which is itself a risk factor for inflammation. Yet in several studies, even  after researchers took obesity into account, the link between foods and inflammation remained, which suggests weight gain isn't the sole driver. "Some of the food components or ingredients may have independent effects on inflammation over and above increased caloric intake," Dr. Si Gaul says.  Anti-inflammatory foods An anti-inflammatory diet should include these foods:  tomatoes olive oil green leafy vegetables, such as spinach, kale, and collards nuts like almonds and walnuts fatty fish like salmon, mackerel, tuna, and sardines fruits such as strawberries, blueberries, cherries, and oranges  Benefits of anti-inflammatory foods On the flip side are beverages and foods that reduce inflammation, in particular fruits and vegetables such as blueberries, apples, and leafy greens that are high in natural antioxidants and polyphenols--protective compounds found in plants.  Studies have also associated nuts with reduced markers of inflammation and a lower risk of cardiovascular disease and diabetes. Coffee, which contains polyphenols and other anti-inflammatory compounds, may protect against inflammation, as well.  Anti-inflammatory diet To reduce levels of inflammation, aim for an overall healthy diet. If you're looking for an eating plan that closely follows the tenets of anti-inflammatory eating, consider the Mediterranean diet, which is high in fruits, vegetables, nuts, whole grains, fish, and healthy oils.  In addition to lowering inflammation, a more natural, less processed diet can have noticeable effects on your physical and emotional health and overall quality of life.   [email or web address]    Zoster Vaccine, Recombinant injection What is this medication? ZOSTER VACCINE (ZOS ter vak SEEN) is a vaccine used to reduce the risk of getting shingles. This vaccine is not used to treat shingles or nerve pain from shingles. This medicine may  be used for other purposes; ask your health care provider or pharmacist if you have questions. COMMON BRAND NAME(S): [facility] What should I tell my care team before I take this medication? They need to know if you have any of these conditions: cancer immune system problems an unusual or allergic reaction to Zoster vaccine, other medications, foods, dyes, or preservatives pregnant or trying to get pregnant breast-feeding How should I use this medication? This vaccine is injected into a muscle. It is given by a health care provider. A copy of Vaccine Information Statements will be given before each vaccination. Be sure to read this information carefully each time. This sheet may change often. Talk to your health care provider about the use of this vaccine in children. This vaccine is not approved for use in children. Overdosage: If you think you have taken too much of this medicine contact a poison control center or emergency room at once. NOTE: This medicine is only for you. Do not share this medicine with others. What if I miss a dose? Keep appointments for follow-up (booster) doses. It is important not to miss your dose. Call your health care provider if you are unable to keep an appointment. What may interact with this medication? medicines that suppress your immune system medicines to treat cancer steroid medicines like prednisone or cortisone This list may not describe all possible interactions. Give your health care provider a list of all the medicines, herbs, non-prescription drugs, or dietary supplements you use. Also tell them  if you smoke, drink alcohol, or use illegal drugs. Some items may interact with your medicine. What should I watch for while using this medication? Visit your health care provider regularly. This vaccine, like all vaccines, may not fully protect everyone. What side effects may I notice from receiving this medication? Side effects that you should report to your  doctor or health care professional as soon as possible: allergic reactions (skin rash, itching or hives; swelling of the face, lips, or tongue) trouble breathing Side effects that usually do not require medical attention (report these to your doctor or health care professional if they continue or are bothersome): chills headache fever nausea pain, redness, or irritation at site where injected tiredness vomiting This list may not describe all possible side effects. Call your doctor for medical advice about side effects. You may report side effects to FDA at 1-800-FDA-1088. Where should I keep my medication? This vaccine is only given by a health care provider. It will not be stored at home. NOTE: This sheet is a summary. It may not cover all possible information. If you have questions about this medicine, talk to your doctor, pharmacist, or health care provider.  2022 Elsevier/Gold Standard (2020-09-21 00:00:00)   Low-Gluten Eating Plan Gluten is a protein that is found in wheat, barley, rye, and triticale, a hybrid of wheat and rye. Some people have a condition that makes them unable to digest gluten. For those people, eating just a small amount of gluten can damage their intestines. This is not a gluten-free eating plan. This low-gluten eating plan is for people who feel better when they eat less gluten. What are tips for following this plan? Reading food labels Make sure to read food labels. Look for wheat, rye, barley, oats (unless it says "certified gluten-free"), malt, and brewer's yeast. If the food contains any of these, it has gluten in it. Wheat-free does not mean gluten-free. Meal planning Eat a variety of foods so you get all of the nutrients that you need. To have more control over the ingredients in your meals, consider making food yourself instead of buying prepared foods. General information Many gluten-free grain products are not fortified with vitamins and minerals  like gluten-containing grains are. Because of this, there is a risk of nutrient deficiencies if you do not eat a balanced diet. Make sure to meet with your health care provider or a registered dietitian to review your diet. When eating out, look for restaurants that have gluten-free options or can make substitutions to accommodate this eating plan. Consider calling a restaurant ahead of time to discuss their menu. What foods can I eat? With this eating plan, you can eat anything that is labeled "gluten-free" or that does not contain wheat or other grains that have gluten. Fruits All fruits, such as bananas, apples, oranges, grapes, papaya, mango, pomegranate, kiwi, grapefruit, and cherries. Vegetables All vegetables that are not in a sauce that would contain gluten, such as lettuce, spinach, peas, beets, cauliflower, cabbage, broccoli, carrots, tomatoes, squash, eggplant, herbs, peppers, onions, cucumbers, Brussels sprouts, yams, and sweet potatoes. Grains Naturally gluten-free grains and flours, including rice, quinoa, corn, buckwheat, cassava, amaranth, millet, polenta or cornmeal, tapioca, flax, chia, yucca, sorghum, and teff. Corn tortillas or taco shells. Oatmeal that is labeled as "gluten-free" or "uncontaminated." Nut flours. Meats and other proteins Beef. Pork. Chicken. [place]. Fish. Eggs. Tofu. Beans. Nuts. Lentils. Dairy Milk. Ice cream. Yogurt. Cheese. Cottage cheese. Beverages Water. Coffee. Tea. Juice. Soda. Seltzer water. Distilled alcohols. Wine.  Seasonings and condiments Mustard. Relish. Ketchup. Barbecue sauce. Vinegar. Mayonnaise. Tamari. Sweets and desserts Honey. Sugar. Maple syrup. Fats and oils Butter. Vegetable oil. Olive oil. Canola oil. [place] oil. Other foods Arrowroot or cornstarch. Potato flour. The items listed above may not be a complete list of foods and beverages you can eat. Contact a dietitian for more information. What foods should I avoid? Grains Wheat.  Barley. Rye. Oatmeal that is not certified gluten-free. Triticale. Bulgur. Meats and other proteins Seitan. Precooked or cured meat, such as sausages or meat loaves. Hot dogs. Salami. Beverages Beer, ale, lager, and malt beverages made from gluten-containing grains. Seasonings and condiments Malt vinegar. Salad dressing. Soy sauce. Teriyaki sauce. Marinades. Check the label of any pre-made sauces for a full list of ingredients. Sweets and desserts Licorice. Brown rice syrup. Pre-made pudding or pudding mixes. Other foods Bouillon cubes. Canned or boxed pre-made soups or soup packets. Bagged chips, such as potato chips and tortilla chips. Seasoning packets. Energy bars. Seasoned rice mixes. Processed foods. The items listed above may not be a complete list of foods and beverages to avoid. Contact a dietitian for more information. Summary Gluten is a protein that is found in wheat, barley, rye, and triticale, a hybrid of wheat and rye. This low-gluten eating plan is for people who feel better when they eat less gluten. Reading food labels of packaged foods is the best way to make sure you are following a low-gluten eating plan. Look for "gluten-free" on the label. Eat a variety of foods and colors and use whole grains that are naturally gluten-free to reduce your risk of having any nutrient deficiencies. This information is not intended to replace advice given to you by your health care provider. Make sure you discuss any questions you have with your health care provider. Document Revised: 01/07/2019 Document Reviewed: 09/05/2017 Elsevier Patient Education  2022 [employer].

## 2020-12-16 LAB — CBC WITH DIFFERENTIAL/PLATELET
Absolute Monocytes: 529 cells/uL (ref 200–950)
Basophils Absolute: 67 cells/uL (ref 0–200)
Basophils Relative: 1 %
Eosinophils Absolute: 121 cells/uL (ref 15–500)
Eosinophils Relative: 1.8 %
HCT: 41.9 % (ref 35.0–45.0)
Hemoglobin: 13.7 g/dL (ref 11.7–15.5)
Lymphs Abs: 2184 cells/uL (ref 850–3900)
MCH: 29.9 pg (ref 27.0–33.0)
MCHC: 32.7 g/dL (ref 32.0–36.0)
MCV: 91.5 fL (ref 80.0–100.0)
MPV: 12 fL (ref 7.5–12.5)
Monocytes Relative: 7.9 %
Neutro Abs: 3799 cells/uL (ref 1500–7800)
Neutrophils Relative %: 56.7 %
Platelets: 310 10*3/uL (ref 140–400)
RBC: 4.58 10*6/uL (ref 3.80–5.10)
RDW: 12 % (ref 11.0–15.0)
Total Lymphocyte: 32.6 %
WBC: 6.7 10*3/uL (ref 3.8–10.8)

## 2020-12-16 LAB — COMPLETE METABOLIC PANEL WITH GFR
AG Ratio: 1.6 (calc) (ref 1.0–2.5)
ALT: 24 U/L (ref 6–29)
AST: 21 U/L (ref 10–35)
Albumin: 4.4 g/dL (ref 3.6–5.1)
Alkaline phosphatase (APISO): 44 U/L (ref 31–125)
BUN: 10 mg/dL (ref 7–25)
CO2: 27 mmol/L (ref 20–32)
Calcium: 9.9 mg/dL (ref 8.6–10.2)
Chloride: 105 mmol/L (ref 98–110)
Creat: 0.8 mg/dL (ref 0.50–0.99)
Globulin: 2.8 g/dL (calc) (ref 1.9–3.7)
Glucose, Bld: 83 mg/dL (ref 65–99)
Potassium: 4.8 mmol/L (ref 3.5–5.3)
Sodium: 140 mmol/L (ref 135–146)
Total Bilirubin: 0.6 mg/dL (ref 0.2–1.2)
Total Protein: 7.2 g/dL (ref 6.1–8.1)
eGFR: 90 mL/min/{1.73_m2} (ref >=60)

## 2020-12-16 LAB — LIPID PANEL
Cholesterol: 204 mg/dL — ABNORMAL HIGH (ref <200)
HDL: 76 mg/dL (ref >=50)
LDL Cholesterol (Calc): 105 mg/dL (calc) — ABNORMAL HIGH
Non-HDL Cholesterol (Calc): 128 mg/dL (calc) (ref <130)
Total CHOL/HDL Ratio: 2.7 (calc) (ref <5.0)
Triglycerides: 134 mg/dL (ref <150)

## 2020-12-16 LAB — URINALYSIS, ROUTINE W REFLEX MICROSCOPIC
Bilirubin Urine: NEGATIVE
Glucose, UA: NEGATIVE
Hgb urine dipstick: NEGATIVE
Ketones, ur: NEGATIVE
Leukocytes,Ua: NEGATIVE
Nitrite: NEGATIVE
Protein, ur: NEGATIVE
Specific Gravity, Urine: 1.007 (ref 1.001–1.035)
pH: 7.5 (ref 5.0–8.0)

## 2020-12-16 LAB — VITAMIN D 25 HYDROXY (VIT D DEFICIENCY, FRACTURES): Vit D, 25-Hydroxy: 75 ng/mL (ref 30–100)

## 2020-12-16 LAB — MAGNESIUM: Magnesium: 2.1 mg/dL (ref 1.5–2.5)

## 2020-12-16 LAB — MICROALBUMIN / CREATININE URINE RATIO
Creatinine, Urine: 43 mg/dL (ref 20–275)
Microalb Creat Ratio: 5 mcg/mg creat (ref <30)
Microalb, Ur: 0.2 mg/dL

## 2020-12-16 LAB — HEMOGLOBIN A1C
Hgb A1c MFr Bld: 5.1 % of total Hgb (ref <5.7)
Mean Plasma Glucose: 100 mg/dL
eAG (mmol/L): 5.5 mmol/L

## 2020-12-16 LAB — TSH: TSH: 0.63 mIU/L

## 2021-02-25 ENCOUNTER — Other Ambulatory Visit: Payer: Self-pay | Admitting: Internal Medicine

## 2021-02-25 DIAGNOSIS — F331 Major depressive disorder, recurrent, moderate: Secondary | ICD-10-CM

## 2021-06-10 ENCOUNTER — Other Ambulatory Visit: Payer: Self-pay | Admitting: Adult Health

## 2021-09-13 ENCOUNTER — Telehealth: Payer: Self-pay | Admitting: Nurse Practitioner

## 2021-09-13 ENCOUNTER — Telehealth: Payer: Self-pay

## 2021-09-13 ENCOUNTER — Other Ambulatory Visit: Payer: Self-pay | Admitting: Nurse Practitioner

## 2021-09-13 DIAGNOSIS — F331 Major depressive disorder, recurrent, moderate: Secondary | ICD-10-CM

## 2021-09-13 MED ORDER — TRAZODONE HCL 150 MG PO TABS: ORAL_TABLET | ORAL | 1 refills | Status: DC

## 2021-09-13 NOTE — Telephone Encounter (Signed)
Left vm and sent mychart message to confirm 09/20/21 appointment-Toni

## 2021-09-13 NOTE — Telephone Encounter (Signed)
Patient is requesting a refill on Trazadone to [facility] on [address].

## 2021-09-20 ENCOUNTER — Ambulatory Visit: Payer: 59 | Admitting: Internal Medicine

## 2021-12-15 ENCOUNTER — Encounter: Payer: 59 | Admitting: Nurse Practitioner

## 2022-01-17 NOTE — Progress Notes (Unsigned)
 Complete Physical  Assessment and Plan:  Diagnoses and all orders for this visit:  Encounter for Annual Physical Exam with abnormal findings Due annually  Health Maintenance reviewed Healthy lifestyle reviewed and goals set  Continue follow up with GYN, reports requested   Gastroesophageal reflux disease without esophagitis Well managed by lifestyle; uses famotidine  -     Magnesium  Right thyroid nodule/ thyroid S/p ablation followed by endocrinology  Check TSH Synthroid managed by endocrine Will request reports  Mixed hyperlipidemia Continue low cholesterol diet, red rice yeast supplement Low risk family history Strong preference to avoid medications LDL goal to maintain <130 Consider cardio IQ or coronary calcium if trending up  -     TSH -     Lipid panel  Vitamin D deficiency Continue supplementation -     VITAMIN D 25 Hydroxy (Vit-D Deficiency, Fractures)  Insomnia   -      D/C Trazodone as no longer effective  -      Start Hydroxyzine 25 mg 1-3 tabs at bedtime  -      Diet/exercise, sleep hygiene discussed  Obesity- BMI 36 Encouraged healthy lifestyle  Recommended diet heavy in fruits and veggies and low in animal meats, cheeses, and dairy products, appropriate calorie intake Defer weight discussion - body dysmorphia Continue with current lifestyle changes, discussed low processed high fiber diet, Follow up at next visit  Seasonal depression (Shawna Forbes) Currently off medication, will advise if symptoms worsen and need to restart Wellbutrin Increase vitamin D during winter Lifestyle discussed: diet/exerise, sleep hygiene, stress management, hydration  Screening for diabetes mellitus -     A1C  Screening for hematuria or proteinuria -     Urinalysis microscopic with reflex culture - Microalbumin/creatinine urine ratio  Medication management -     CBC with Differential/Platelet -     CMP/GFR  Screening for ischemic heart disease - EKG  Screening for  AAA - U/S ABD Retroperitoneal LTD  Screening colon cancer - GI referral placed     Discussed med's effects and SE's. Screening labs and tests as requested with regular follow-up as recommended. Over 40 minutes of exam, counseling, chart review, and complex, high level critical decision making was performed this visit.   Future Appointments  Date Time Provider [place]  01/19/2023 10:00 AM Shawna Rossetti, NP [facility] None    HPI  52 y.o. Caucasian female,  presents for a complete physical and follow up for has Mixed hyperlipidemia; Vitamin D deficiency; GERD (gastroesophageal reflux disease); Insomnia; Overweight (BMI 25.0-29.9); Hypothyroid; Major depression in remission (Shawna Forbes); CKD (chronic kidney disease) stage 2, GFR 60-89 ml/min; B12 deficiency; Onychomycosis; and Seasonal depression (Shawna Forbes) on their problem list.   Married, 2 kids, works as Designer, television/film set working hybrid, had promotion.   She recently had covid 12/26/21 but is feeling recovered. She still has limited smell.   She sees GYN Dr. Corinna Forbes annually for PAPs and 3D mammogram. No issues/concerns today. On Mirena.   She reports stress levels are significantly improved; she is seeing a counselor monthly; She has a blended family, adult autistic son. She continues with trazodone nightly which helps he fall back asleep and has significantly improved quality of life.Trazodone is not helping as well as it used to, she falls asleep ok but wakes up throughout the night.  Not currently on Wellbutrin but will let us know if she needs to restart.   She continues to deal with toenail fungus and has discontinued treatments currently.  BMI is Body mass index is 36.18 kg/m.,  reports body dysmorphia, working with therapist.  She is using a walking pad under her desk when working at home. She works 3 days at home , 2 days at work.  Wt Readings from Last 3 Encounters:  01/18/22 217 lb 6.4 oz (98.6 kg)  12/15/19 167 lb (75.8 kg)   12/09/18 199 lb 9.6 oz (90.5 kg)   Today their BP is BP: 102/74  BP Readings from Last 3 Encounters:  01/18/22 102/74  12/15/20 124/82  12/15/19 106/70  She does workout. She denies chest pain, shortness of breath, dizziness.   She is not on cholesterol medication (on red yeast rice supplement) and denies myalgias. Her cholesterol is not at goal. She has been working on lifestyle to avoid cholesterol medication. Denies family history of MI/CVA until grandfather in his 59s.  The cholesterol last visit was:   Lab Results  Component Value Date   CHOL 204 (H) 12/15/2020   HDL 76 12/15/2020   LDLCALC 105 (H) 12/15/2020   TRIG 134 12/15/2020   CHOLHDL 2.7 12/15/2020    Last A1C in the office was:  Lab Results  Component Value Date   HGBA1C 5.1 12/15/2020    Patient with hx of thyroid nodule, mild hyperthyroid, was referred to Dr. Reynold Forbes and underwent uptake study and ablation per his recommendations and now on synthroid, taking 112 mcg 6 days a week then 11/2 of 112 on Wednesdays. She is getting levels checked q3mLab Results  Component Value Date   TSH 0.63 12/15/2020   She is drinking lots of water. Last GFR: Lab Results  Component Value Date   EGFR 90 12/15/2020    Patient is on Vitamin D supplement and below goal at the last visit, taking 5000 IU daily:    Lab Results  Component Value Date   VD25OH 75811/30/2022        Current Medications:  Current Outpatient Medications on File Prior to Visit  Medication Sig Dispense Refill   Ascorbic Acid (VITAMIN C) 1000 MG tablet Take 1,000 mg by mouth daily.     aspirin 81 MG tablet Take 81 mg by mouth daily.     buPROPion (WELLBUTRIN XL) 150 MG 24 hr tablet TAKE 1 TABLET BY MOUTH EVERY DAY IN THE MORNING 90 tablet 1   calcium carbonate (OS-CAL) 600 MG TABS tablet Take 600 mg by mouth 2 (two) times daily with a meal.     eletriptan (RELPAX) 40 MG tablet TAKE 1 TABLET BY MOUTH AS NEEDED FOR MIGRAINE OR HEADACHE MAY REPEAT  IN 2 HOURS IF NEEDED 10 tablet 5   fluticasone (FLONASE) 50 MCG/ACT nasal spray Place into both nostrils daily.     levonorgestrel (MIRENA, 52 MG,) 20 MCG/24HR IUD Mirena 20 mcg/24 hours (7 yrs) 52 mg intrauterine device  Take 1 device by intrauterine route.     levothyroxine (SYNTHROID) 100 MCG tablet Take 100 mcg by mouth daily before breakfast. 112 mcg 6 days a week and a pill and a half 1 day a week     Magnesium 250 MG TABS Take by mouth.     meclizine (ANTIVERT) 25 MG tablet Take 1 tablet (25 mg total) by mouth 3 (three) times daily as needed for dizziness. 30 tablet 1   Multiple Vitamins-Minerals (MULTIVITAMIN WITH MINERALS) tablet Take 1 tablet by mouth. Three times per week     OVER THE COUNTER MEDICATION 1 tablet daily. [employer] brand allergy pill  Red Yeast Rice 600 MG CAPS Take 600 mg by mouth 2 (two) times daily.     terbinafine (LAMISIL) 250 MG tablet Please take one a day x 7days, repeat every 4 weeks x 4 months (Patient not taking: Reported on 12/15/2020) 28 tablet 0   terbinafine (LAMISIL) 250 MG tablet Take 1 tablet (250 mg total) by mouth daily. (Patient not taking: Reported on 12/15/2020) 45 tablet 0   traZODone (DESYREL) 150 MG tablet TAKE 1 TABLET 1 HOUR BEFORE BEDTIME AS NEEDED FOR SLEEP 90 tablet 1   Turmeric (QC TUMERIC COMPLEX PO) Take 250 mg by mouth.     Vitamin D, Cholecalciferol, 25 MCG (1000 UT) TABS Take 5,000 Units by mouth daily. 60 tablet    Zinc 50 MG TABS Take by mouth daily.     No current facility-administered medications on file prior to visit.   Allergies:  Allergies  Allergen Reactions   Tetracyclines & Related     Hives   Thimerosal (Thiomersal) Other (See Comments)    Son with autism and has been informed to never have vaccine that contains thimersal    Medical History:  She has Mixed hyperlipidemia; Vitamin D deficiency; GERD (gastroesophageal reflux disease); Insomnia; Overweight (BMI 25.0-29.9); Hypothyroid; Major depression in remission  ([place]); CKD (chronic kidney disease) stage 2, GFR 60-89 ml/min; B12 deficiency; Onychomycosis; and Seasonal depression ([place]) on their problem list.  Health Maintenance:   Immunization History  Administered Date(s) Administered   Influenza Inj Mdck Quad Pf 11/06/2018   Influenza,inj,Quad PF,6+ Mos 11/22/2017, 12/15/2020   Influenza-Unspecified 11/23/2016, 11/17/2019   Moderna Sars-Covid-2 Vaccination 04/25/2019, 05/23/2019   PFIZER(Purple Top)SARS-COV-2 Vaccination 12/13/2019   PPD Test 11/20/2013   Td 01/17/2004   Tdap 07/17/2013   Tetanus: 2015 Flu vaccine: fall 2021 at pharmacy  Pneumonia: low risk  Shingrix: next year, check with insurance  Covid 19: 2/2 + booster  LMP: No LMP recorded. (Menstrual status: IUD). Pap/pelvic: UTD per patient, Sees GYN annually  MGM: UTD in Dr. Gregor Hams office  - report requested   Colonoscopy: - Refer to GI EGD: 2016  Last Dental Exam: goes q 23m last 09/2020, Dr. TTama GanderLast Eye Exam: Annually - last 20212, Dr. KPeter Garter- [facility] - no issues Last Skin Exam: a few years back with Dr. GDelman Cheadle- will schedule follow up  Patient Care Team: MUnk Pinto MD as PCP - General (Internal Medicine) LLouretta Shorten MD as Consulting Physician (Obstetrics and Gynecology) LMichel Santee MD as Consulting Physician (Neurology) GJari Pigg MD as Consulting Physician (Dermatology)  Surgical History:  She has a past surgical history that includes Gynecologic cryosurgery; Mole removal; and Wisdom tooth extraction (1989). Family History:  Herfamily history includes Bipolar disorder in her daughter; Cancer in her maternal grandfather; Colon polyps in her mother; Dementia in her maternal grandmother; Diabetes in an other family member; Heart disease in her maternal grandfather; Hyperlipidemia in her mother; Hypertension in her mother; Hypothyroidism in her father and mother; Irritable bowel syndrome in her mother; Osteopenia in her paternal grandmother; Stroke  in her paternal grandfather. Social History:  She reports that she has never smoked. She has never used smokeless tobacco. She reports current alcohol use. She reports that she does not use drugs.  Review of Systems: Review of Systems  Constitutional:  Negative for malaise/fatigue and weight loss.  HENT:  Negative for hearing loss and tinnitus.   Eyes:  Negative for blurred vision and double vision.  Respiratory:  Negative for cough, shortness of  breath and wheezing.   Cardiovascular:  Negative for chest pain, palpitations, orthopnea, claudication and leg swelling.  Gastrointestinal:  Negative for abdominal pain, blood in stool, constipation, diarrhea, heartburn, melena, nausea and vomiting.  Genitourinary: Negative.   Musculoskeletal:  Negative for joint pain and myalgias.  Skin:  Negative for rash.  Neurological:  Negative for dizziness, tingling, sensory change, weakness and headaches.  Endo/Heme/Allergies:  Negative for environmental allergies and polydipsia.  Psychiatric/Behavioral:  Positive for depression. Negative for substance abuse and suicidal ideas. The patient has insomnia. The patient is not nervous/anxious.   All other systems reviewed and are negative.   Physical Exam: Estimated body mass index is 36.18 kg/m as calculated from the following:   Height as of this encounter: _0  (1.651 m).   Weight as of this encounter: 217 lb 6.4 oz (98.6 kg). BP 102/74   Pulse 81   Temp (!) 97.5 F (36.4 C)   Ht _1  (1.651 m)   Wt 217 lb 6.4 oz (98.6 kg)   SpO2 96%   BMI 36.18 kg/m  General Appearance: Well nourished, in no apparent distress.  Eyes: PERRLA, EOMs, conjunctiva no swelling or erythema Sinuses: No Frontal/maxillary tenderness  ENT/Mouth: Ext aud canals clear, normal light reflex with TMs without erythema, bulging. Good dentition. No erythema, swelling, or exudate on post pharynx. Tonsils not swollen or erythematous. Hearing normal.  Neck: Supple, thyroid normal.  No bruits  Respiratory: Respiratory effort normal, BS equal bilaterally without rales, rhonchi, wheezing or stridor.  Cardio: RRR without murmurs, rubs or gallops. Brisk peripheral pulses without edema.  Chest: symmetric, with normal excursions and percussion.  Breasts: Defer to GYN Abdomen: Soft, nontender, no guarding, rebound, hernias, masses, or organomegaly.  Lymphatics: Non tender without lymphadenopathy.  Genitourinary: Defer to GYN Musculoskeletal: Full ROM all peripheral extremities,5/5 strength, and normal gait.  Skin: Warm, dry without rashes, ecchymosis.  Neuro: Cranial nerves intact, reflexes equal bilaterally. Normal muscle tone, no cerebellar symptoms. Sensation intact.  Psych: Awake and oriented X 3, normal affect, Insight and Judgment appropriate.   EKG: NSR, no ST changes AAA:  < 3 cm    Raylan Hanton E  10:12 AM [facility]

## 2022-01-18 ENCOUNTER — Encounter: Payer: Self-pay | Admitting: Nurse Practitioner

## 2022-01-18 ENCOUNTER — Ambulatory Visit (INDEPENDENT_AMBULATORY_CARE_PROVIDER_SITE_OTHER): Payer: 59 | Admitting: Nurse Practitioner

## 2022-01-18 VITALS — BP 102/74 | HR 81 | Temp 97.5°F | Ht 65.0 in | Wt 217.4 lb

## 2022-01-18 DIAGNOSIS — Z79899 Other long term (current) drug therapy: Secondary | ICD-10-CM

## 2022-01-18 DIAGNOSIS — E559 Vitamin D deficiency, unspecified: Secondary | ICD-10-CM

## 2022-01-18 DIAGNOSIS — Z130 Encounter for screening for diseases of the blood and blood-forming organs and certain disorders involving the immune mechanism: Secondary | ICD-10-CM

## 2022-01-18 DIAGNOSIS — I10 Essential (primary) hypertension: Secondary | ICD-10-CM

## 2022-01-18 DIAGNOSIS — Z0000 Encounter for general adult medical examination without abnormal findings: Secondary | ICD-10-CM

## 2022-01-18 DIAGNOSIS — Z131 Encounter for screening for diabetes mellitus: Secondary | ICD-10-CM

## 2022-01-18 DIAGNOSIS — E890 Postprocedural hypothyroidism: Secondary | ICD-10-CM

## 2022-01-18 DIAGNOSIS — Z1389 Encounter for screening for other disorder: Secondary | ICD-10-CM

## 2022-01-18 DIAGNOSIS — F338 Other recurrent depressive disorders: Secondary | ICD-10-CM

## 2022-01-18 DIAGNOSIS — I700 Atherosclerosis of aorta: Secondary | ICD-10-CM | POA: Diagnosis not present

## 2022-01-18 DIAGNOSIS — Z136 Encounter for screening for cardiovascular disorders: Secondary | ICD-10-CM

## 2022-01-18 DIAGNOSIS — K219 Gastro-esophageal reflux disease without esophagitis: Secondary | ICD-10-CM

## 2022-01-18 DIAGNOSIS — E782 Mixed hyperlipidemia: Secondary | ICD-10-CM

## 2022-01-18 DIAGNOSIS — Z0001 Encounter for general adult medical examination with abnormal findings: Secondary | ICD-10-CM

## 2022-01-18 DIAGNOSIS — N182 Chronic kidney disease, stage 2 (mild): Secondary | ICD-10-CM

## 2022-01-18 DIAGNOSIS — Z1211 Encounter for screening for malignant neoplasm of colon: Secondary | ICD-10-CM

## 2022-01-18 DIAGNOSIS — G4700 Insomnia, unspecified: Secondary | ICD-10-CM

## 2022-01-18 MED ORDER — HYDROXYZINE HCL 25 MG PO TABS: ORAL_TABLET | ORAL | 2 refills | Status: DC

## 2022-01-18 NOTE — Patient Instructions (Signed)
Insomnia Insomnia is a sleep disorder that makes it difficult to fall asleep or stay asleep. Insomnia can cause fatigue, low energy, difficulty concentrating, mood swings, and poor performance at work or school. There are three different ways to classify insomnia: Difficulty falling asleep. Difficulty staying asleep. Waking up too early in the morning. Any type of insomnia can be long-term (chronic) or short-term (acute). Both are common. Short-term insomnia usually lasts for 3 months or less. Chronic insomnia occurs at least three times a week for longer than 3 months. What are the causes? Insomnia may be caused by another condition, situation, or substance, such as: Having certain mental health conditions, such as anxiety and depression. Using caffeine, alcohol, tobacco, or drugs. Having gastrointestinal conditions, such as gastroesophageal reflux disease (GERD). Having certain medical conditions. These include: Asthma. Alzheimer's disease. Stroke. Chronic pain. An overactive thyroid gland (hyperthyroidism). Other sleep disorders, such as restless legs syndrome and sleep apnea. Menopause. Sometimes, the cause of insomnia may not be known. What increases the risk? Risk factors for insomnia include: Gender. Females are affected more often than males. Age. Insomnia is more common as people get older. Stress and certain medical and mental health conditions. Lack of exercise. Having an irregular work schedule. This may include working night shifts and traveling between different time zones. What are the signs or symptoms? If you have insomnia, the main symptom is having trouble falling asleep or having trouble staying asleep. This may lead to other symptoms, such as: Feeling tired or having low energy. Feeling nervous about going to sleep. Not feeling rested in the morning. Having trouble concentrating. Feeling irritable, anxious, or depressed. How is this diagnosed? This condition  may be diagnosed based on: Your symptoms and medical history. Your health care provider may ask about: Your sleep habits. Any medical conditions you have. Your mental health. A physical exam. How is this treated? Treatment for insomnia depends on the cause. Treatment may focus on treating an underlying condition that is causing the insomnia. Treatment may also include: Medicines to help you sleep. Counseling or therapy. Lifestyle adjustments to help you sleep better. Follow these instructions at home: Eating and drinking  Limit or avoid alcohol, caffeinated beverages, and products that contain nicotine and tobacco, especially close to bedtime. These can disrupt your sleep. Do not eat a large meal or eat spicy foods right before bedtime. This can lead to digestive discomfort that can make it hard for you to sleep. Sleep habits  Keep a sleep diary to help you and your health care provider figure out what could be causing your insomnia. Write down: When you sleep. When you wake up during the night. How well you sleep and how rested you feel the next day. Any side effects of medicines you are taking. What you eat and drink. Make your bedroom a dark, comfortable place where it is easy to fall asleep. Put up shades or blackout curtains to block light from outside. Use a white noise machine to block noise. Keep the temperature cool. Limit screen use before bedtime. This includes: Not watching TV. Not using your smartphone, tablet, or computer. Stick to a routine that includes going to bed and waking up at the same times every day and night. This can help you fall asleep faster. Consider making a quiet activity, such as reading, part of your nighttime routine. Try to avoid taking naps during the day so that you sleep better at night. Get out of bed if you are still awake after   15 minutes of trying to sleep. Keep the lights down, but try reading or doing a quiet activity. When you feel  sleepy, go back to bed. General instructions Take over-the-counter and prescription medicines only as told by your health care provider. Exercise regularly as told by your health care provider. However, avoid exercising in the hours right before bedtime. Use relaxation techniques to manage stress. Ask your health care provider to suggest some techniques that may work well for you. These may include: Breathing exercises. Routines to release muscle tension. Visualizing peaceful scenes. Make sure that you drive carefully. Do not drive if you feel very sleepy. Keep all follow-up visits. This is important. Contact a health care provider if: You are tired throughout the day. You have trouble in your daily routine due to sleepiness. You continue to have sleep problems, or your sleep problems get worse. Get help right away if: You have thoughts about hurting yourself or someone else. Get help right away if you feel like you may hurt yourself or others, or have thoughts about taking your own life. Go to your nearest emergency room or: Call 911. Call the National Suicide Prevention Lifeline at 1-800-273-8255 or 988. This is open 24 hours a day. Text the Crisis Text Line at 741741. Summary Insomnia is a sleep disorder that makes it difficult to fall asleep or stay asleep. Insomnia can be long-term (chronic) or short-term (acute). Treatment for insomnia depends on the cause. Treatment may focus on treating an underlying condition that is causing the insomnia. Keep a sleep diary to help you and your health care provider figure out what could be causing your insomnia. This information is not intended to replace advice given to you by your health care provider. Make sure you discuss any questions you have with your health care provider. Document Revised: 12/13/2020 Document Reviewed: 12/13/2020 [employer] Patient Education  2023 [employer]  

## 2022-01-19 ENCOUNTER — Other Ambulatory Visit: Payer: Self-pay | Admitting: Nurse Practitioner

## 2022-01-19 LAB — URINALYSIS W MICROSCOPIC + REFLEX CULTURE
Bacteria, UA: NONE SEEN /HPF
Bilirubin Urine: NEGATIVE
Glucose, UA: NEGATIVE
Hgb urine dipstick: NEGATIVE
Hyaline Cast: NONE SEEN /LPF
Ketones, ur: NEGATIVE
Leukocyte Esterase: NEGATIVE
Nitrites, Initial: NEGATIVE
Protein, ur: NEGATIVE
RBC / HPF: NONE SEEN /HPF (ref 0–2)
Specific Gravity, Urine: 1.003 (ref 1.001–1.035)
Squamous Epithelial / HPF: NONE SEEN /HPF (ref <=5)
WBC, UA: NONE SEEN /HPF (ref 0–5)
pH: 7 (ref 5.0–8.0)

## 2022-01-19 LAB — COMPLETE METABOLIC PANEL WITH GFR
AG Ratio: 1.4 (calc) (ref 1.0–2.5)
ALT: 45 U/L — ABNORMAL HIGH (ref 6–29)
AST: 31 U/L (ref 10–35)
Albumin: 4.5 g/dL (ref 3.6–5.1)
Alkaline phosphatase (APISO): 55 U/L (ref 37–153)
BUN: 16 mg/dL (ref 7–25)
CO2: 25 mmol/L (ref 20–32)
Calcium: 10 mg/dL (ref 8.6–10.4)
Chloride: 103 mmol/L (ref 98–110)
Creat: 0.85 mg/dL (ref 0.50–1.03)
Globulin: 3.2 g/dL (calc) (ref 1.9–3.7)
Glucose, Bld: 84 mg/dL (ref 65–99)
Potassium: 4.2 mmol/L (ref 3.5–5.3)
Sodium: 139 mmol/L (ref 135–146)
Total Bilirubin: 0.5 mg/dL (ref 0.2–1.2)
Total Protein: 7.7 g/dL (ref 6.1–8.1)
eGFR: 83 mL/min/{1.73_m2} (ref >=60)

## 2022-01-19 LAB — CBC WITH DIFFERENTIAL/PLATELET
Absolute Monocytes: 569 cells/uL (ref 200–950)
Basophils Absolute: 43 cells/uL (ref 0–200)
Basophils Relative: 0.6 %
Eosinophils Absolute: 209 cells/uL (ref 15–500)
Eosinophils Relative: 2.9 %
HCT: 40.3 % (ref 35.0–45.0)
Hemoglobin: 13.7 g/dL (ref 11.7–15.5)
Lymphs Abs: 2434 cells/uL (ref 850–3900)
MCH: 29.5 pg (ref 27.0–33.0)
MCHC: 34 g/dL (ref 32.0–36.0)
MCV: 86.9 fL (ref 80.0–100.0)
MPV: 12.4 fL (ref 7.5–12.5)
Monocytes Relative: 7.9 %
Neutro Abs: 3946 cells/uL (ref 1500–7800)
Neutrophils Relative %: 54.8 %
Platelets: 289 10*3/uL (ref 140–400)
RBC: 4.64 10*6/uL (ref 3.80–5.10)
RDW: 12.7 % (ref 11.0–15.0)
Total Lymphocyte: 33.8 %
WBC: 7.2 10*3/uL (ref 3.8–10.8)

## 2022-01-19 LAB — HEMOGLOBIN A1C
Hgb A1c MFr Bld: 5.5 % of total Hgb (ref <5.7)
Mean Plasma Glucose: 111 mg/dL
eAG (mmol/L): 6.2 mmol/L

## 2022-01-19 LAB — LIPID PANEL
Cholesterol: 300 mg/dL — ABNORMAL HIGH (ref <200)
HDL: 84 mg/dL (ref >=50)
LDL Cholesterol (Calc): 191 mg/dL (calc) — ABNORMAL HIGH
Non-HDL Cholesterol (Calc): 216 mg/dL (calc) — ABNORMAL HIGH (ref <130)
Total CHOL/HDL Ratio: 3.6 (calc) (ref <5.0)
Triglycerides: 121 mg/dL (ref <150)

## 2022-01-19 LAB — TSH: TSH: 0.54 mIU/L

## 2022-01-19 LAB — MICROALBUMIN / CREATININE URINE RATIO
Creatinine, Urine: 15 mg/dL — ABNORMAL LOW (ref 20–275)
Microalb, Ur: <0.2 mg/dL

## 2022-01-19 LAB — MAGNESIUM: Magnesium: 2 mg/dL (ref 1.5–2.5)

## 2022-01-19 LAB — VITAMIN D 25 HYDROXY (VIT D DEFICIENCY, FRACTURES): Vit D, 25-Hydroxy: 55 ng/mL (ref 30–100)

## 2022-01-19 LAB — NO CULTURE INDICATED

## 2022-01-19 MED ORDER — ROSUVASTATIN CALCIUM 5 MG PO TABS: 5.0000 mg | ORAL_TABLET | Freq: Every day | ORAL | 11 refills | Status: DC

## 2022-02-14 ENCOUNTER — Other Ambulatory Visit: Payer: Self-pay | Admitting: Nurse Practitioner

## 2022-02-14 DIAGNOSIS — G4700 Insomnia, unspecified: Secondary | ICD-10-CM

## 2022-03-02 ENCOUNTER — Other Ambulatory Visit: Payer: Self-pay | Admitting: Nurse Practitioner

## 2022-03-02 DIAGNOSIS — F331 Major depressive disorder, recurrent, moderate: Secondary | ICD-10-CM

## 2022-03-15 ENCOUNTER — Encounter: Payer: Self-pay | Admitting: Nurse Practitioner

## 2022-03-15 DIAGNOSIS — G4700 Insomnia, unspecified: Secondary | ICD-10-CM

## 2022-03-15 MED ORDER — TRAZODONE HCL 50 MG PO TABS: ORAL_TABLET | ORAL | 2 refills | Status: DC

## 2022-03-25 ENCOUNTER — Other Ambulatory Visit: Payer: Self-pay | Admitting: Nurse Practitioner

## 2022-03-25 DIAGNOSIS — F331 Major depressive disorder, recurrent, moderate: Secondary | ICD-10-CM

## 2022-03-25 DIAGNOSIS — G4700 Insomnia, unspecified: Secondary | ICD-10-CM

## 2022-03-28 ENCOUNTER — Other Ambulatory Visit: Payer: Self-pay | Admitting: Nurse Practitioner

## 2022-03-28 ENCOUNTER — Encounter: Payer: Self-pay | Admitting: Nurse Practitioner

## 2022-03-28 DIAGNOSIS — G4700 Insomnia, unspecified: Secondary | ICD-10-CM

## 2022-03-28 MED ORDER — TRAZODONE HCL 150 MG PO TABS: ORAL_TABLET | ORAL | 2 refills | Status: DC

## 2022-07-19 ENCOUNTER — Ambulatory Visit: Payer: 59 | Admitting: Nurse Practitioner

## 2022-09-17 ENCOUNTER — Other Ambulatory Visit: Payer: Self-pay | Admitting: Internal Medicine

## 2022-09-17 DIAGNOSIS — G4700 Insomnia, unspecified: Secondary | ICD-10-CM

## 2022-12-21 ENCOUNTER — Other Ambulatory Visit: Payer: Self-pay | Admitting: Nurse Practitioner

## 2022-12-21 DIAGNOSIS — G4700 Insomnia, unspecified: Secondary | ICD-10-CM

## 2023-01-18 NOTE — Progress Notes (Signed)
 Complete Physical  Assessment and Plan:  Diagnoses and all orders for this visit:  Encounter for Annual Physical Exam with abnormal findings Due annually  Health Maintenance reviewed Healthy lifestyle reviewed and goals set Continue follow up with GYN, 12/11/22  Gastroesophageal reflux disease without esophagitis Well managed by lifestyle; uses famotidine  -     Magnesium   Right thyroid  nodule/ postablative hypothyroidism S/p ablation followed by endocrinology  Check TSH Synthroid managed by endocrine- currently on Levothyroxine 125 mcg daily Will request reports  Mixed hyperlipidemia/Statin myalgia Continue low cholesterol diet, red rice yeast supplement Low risk family history Strong preference to avoid medications LDL goal to maintain <130 Consider cardio IQ or coronary calcium  if trending up  -     TSH -     Lipid panel  Vitamin D  deficiency Continue supplementation -     VITAMIN D  25 Hydroxy (Vit-D Deficiency, Fractures)  Insomnia Love  -  Trazodone  150 mg daily as needed for sleep -   Uses Alprazolam infrequently- last filled 12/11/22 Alprazolam 0.25 mg #30 filled, previously filled 03/10/21 #30   Class 2 severe Obesity Due to Excess Calories- BMI 35 with serious comorbidity of HLD Encouraged healthy lifestyle  Recommended diet heavy in fruits and veggies and low in animal meats, cheeses, and dairy products, appropriate calorie intake Defer weight discussion - body dysmorphia Continue with current lifestyle changes, discussed low processed high fiber diet, Follow up at next visit  Seasonal depression (HCC) Currently off medication, will advise if symptoms worsen and need to restart Wellbutrin  Increase vitamin D  during winter Lifestyle discussed: diet/exerise, sleep hygiene, stress management, hydration  Screening for diabetes mellitus -     A1C  Screening for hematuria or proteinuria -     Urinalysis microscopic with reflex  culture - Microalbumin/creatinine urine ratio  Screening for ischemic heart disease - EKG  Screening for AAA - U/S ABD Retroperitoneal LTD  Screening colon cancer - GI referral placed last year but has not been seen- Dr Albertus- reluctant to have colonoscopy - Cologuard ordered  Medication management -     CBC with Differential/Platelet -     COMPLETE METABOLIC PANEL WITH GFR -     Magnesium  -     Lipid panel -     TSH -     Hemoglobin A1C w/out eAG -     VITAMIN D  25 Hydroxy (Vit-D Deficiency, Fractures) -     EKG 12-Lead -     US , RETROPERITNL ABD,  LTD -     Urinalysis, Routine w reflex microscopic -     Microalbumin / creatinine urine ratio  Migraine without aura and without status migrainosus, not intractable Push fluids Relpax  as needed -     eletriptan  (RELPAX ) 40 MG tablet; TAKE 1 TABLET BY MOUTH AS NEEDED FOR MIGRAINE OR HEADACHE MAY REPEAT IN 2 HOURS IF NEEDED       Discussed med's effects and SE's. Screening labs and tests as requested with regular follow-up as recommended. Over 40 minutes of exam, counseling, chart review, and complex, high level critical decision making was performed this visit.   Future Appointments  Date Time Provider Department Center  01/22/2024 10:00 AM Santo Zahradnik E, NP [facility] None    HPI  53 y.o. Caucasian female,  presents for a complete physical and follow up for has Mixed hyperlipidemia; Vitamin D  deficiency; GERD (gastroesophageal reflux disease); Insomnia; Overweight (BMI 25.0-29.9); Hypothyroid; Major depression in remission (HCC); CKD (chronic kidney disease) stage 2, GFR 60-89  ml/min; B12 deficiency; Onychomycosis; and Seasonal depression (HCC) on their problem list.   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   She sees GYN Dr. Marget annually for PAPs and 3D mammogram. No issues/concerns today. On Mirena . She is having difficulty with menopause. Hot Flashes, irritability, itchy skin,  weight gain. Continues to have Mirena  IUD- can keep 3 more years.   Her migraines have been somewhat worse.   She reports stress levels are significantly improved; she is seeing a counselor monthly; She has a blended family, adult autistic son. She continues with trazodone  nightly which helps he fall back asleep and has significantly improved quality of life..  Not currently on Wellbutrin  but will let us  know if she needs to restart. She does use Alprazolam rarely for anxiety 30 pills lasts over 1 year  She continues to deal with toenail fungus and has no desire for further treatment.   BMI is Body mass index is 35.21 kg/m.,  reports body dysmorphia, working with therapist.  She is using a walking pad under her desk when working at home. She works 3 days at home , 2 days at work. She is down 6 pounds from last year- did use Saxenda but not covered.  Wt Readings from Last 3 Encounters:  01/19/23 211 lb 9.6 oz (96 kg)  01/18/22 217 lb 6.4 oz (98.6 kg)  12/15/19 167 lb (75.8 kg)   BP well controlled without medication. Today their BP is BP: 104/76  BP Readings from Last 3 Encounters:  01/19/23 104/76  01/18/22 102/74  12/15/20 124/82  She does workout. She denies chest pain, shortness of breath, dizziness.   She is not on cholesterol medication (on red yeast rice supplement) and denies myalgias. Her cholesterol is not at goal. She had significant myalgias with Rosuvastatin . She has been working on lifestyle to avoid cholesterol medication. Denies family history of MI/CVA until grandfather in his 25s.  She has eliminated red meat.  Still has some dairy, infrequent eggs, rre fried foods. The cholesterol last visit was:   Lab Results  Component Value Date   CHOL 300 (H) 01/18/2022   HDL 84 01/18/2022   LDLCALC 191 (H) 01/18/2022   TRIG 121 01/18/2022   CHOLHDL 3.6 01/18/2022    Last A1C in the office was:  Lab Results  Component Value Date   HGBA1C 5.5 01/18/2022    Patient with hx of  thyroid  nodule, mild hyperthyroid, was referred to Dr. Garnette Ore and underwent uptake study and ablation per his recommendations and now on synthroid, taking 125 mcg daily. She is getting levels checked q30m Lab Results  Component Value Date   TSH 0.54 01/18/2022   She is drinking lots of water. Last GFR: Lab Results  Component Value Date   EGFR 83 01/18/2022    Patient is on Vitamin D  supplement and below goal at the last visit, taking 5000 IU daily:    Lab Results  Component Value Date   VD25OH 55 01/18/2022        Current Medications:  Current Outpatient Medications on File Prior to Visit  Medication Sig Dispense Refill   ALPRAZolam (XANAX) 0.25 MG tablet TAKE 1 TABLET BY MOUTH EVERY 8 HOURS IF NEEDED FOR ANXEITY     Ascorbic Acid (VITAMIN C) 1000 MG tablet Take 1,000 mg by mouth daily.     aspirin 81 MG tablet Take 81 mg by mouth daily.     calcium  carbonate (OS-CAL) 600 MG  TABS tablet Take 600 mg by mouth 2 (two) times daily with a meal.     eletriptan  (RELPAX ) 40 MG tablet TAKE 1 TABLET BY MOUTH AS NEEDED FOR MIGRAINE OR HEADACHE MAY REPEAT IN 2 HOURS IF NEEDED 10 tablet 5   fluticasone (FLONASE) 50 MCG/ACT nasal spray Place into both nostrils daily.     levonorgestrel  (MIRENA , 52 MG,) 20 MCG/24HR IUD Mirena  20 mcg/24 hours (7 yrs) 52 mg intrauterine device  Take 1 device by intrauterine route.     levothyroxine (SYNTHROID) 125 MCG tablet Take 125 mcg by mouth daily.     Magnesium  250 MG TABS Take by mouth.     Multiple Vitamins-Minerals (MULTIVITAMIN WITH MINERALS) tablet Take 1 tablet by mouth. Three times per week     Omega-3 Fatty Acids (FISH OIL PO) Take by mouth.     OVER THE COUNTER MEDICATION 1 tablet daily. [employer] brand allergy pill     Red Yeast Rice 600 MG CAPS Take 600 mg by mouth 2 (two) times daily.     traZODone  (DESYREL ) 150 MG tablet TAKE 1/2 - 1 TABLET BY MOUTH FOR SLEEP 90 tablet 2   Turmeric (QC TUMERIC COMPLEX PO) Take 250 mg by mouth.     Vitamin  D, Cholecalciferol , 25 MCG (1000 UT) TABS Take 5,000 Units by mouth daily. 60 tablet    Zinc 50 MG TABS Take by mouth daily.     hydrOXYzine  (ATARAX ) 25 MG tablet TAKE 1-3 TABS AT BEDTIME 270 tablet 1   levothyroxine (SYNTHROID) 100 MCG tablet Take 100 mcg by mouth daily before breakfast. 112 mcg 6 days a week and a pill and a half 1 day a week     rosuvastatin  (CRESTOR ) 5 MG tablet Take 1 tablet (5 mg total) by mouth daily. 30 tablet 11   No current facility-administered medications on file prior to visit.   Allergies:  Allergies  Allergen Reactions   Tetracyclines & Related     Hives   Thimerosal (Thiomersal) Other (See Comments)    Son with autism and has been informed to never have vaccine that contains thimersal    Medical History:  She has Mixed hyperlipidemia; Vitamin D  deficiency; GERD (gastroesophageal reflux disease); Insomnia; Overweight (BMI 25.0-29.9); Hypothyroid; Major depression in remission (HCC); CKD (chronic kidney disease) stage 2, GFR 60-89 ml/min; B12 deficiency; Onychomycosis; and Seasonal depression (HCC) on their problem list.  Health Maintenance:   Immunization History  Administered Date(s) Administered   Influenza Inj Mdck Quad Pf 11/06/2018   Influenza,inj,Quad PF,6+ Mos 11/22/2017, 12/15/2020   Influenza,trivalent, recombinat, inj, PF 12/14/2022   Influenza-Unspecified 11/23/2016, 11/17/2019, 12/24/2021   Moderna Sars-Covid-2 Vaccination 04/25/2019, 05/23/2019   PFIZER(Purple Top)SARS-COV-2 Vaccination 12/13/2019   PPD Test 11/20/2013   Td 01/17/2004   Tdap 07/17/2013   Health Maintenance  Topic Date Due   Zoster Vaccines- Shingrix (1 of 2) Never done   Fecal DNA (Cologuard)  Never done   Cervical Cancer Screening (HPV/Pap Cotest)  12/02/2016   MAMMOGRAM  01/02/2021   COVID-19 Vaccine (4 - 2024-25 season) 09/17/2022   DTaP/Tdap/Td (3 - Td or Tdap) 07/18/2023   INFLUENZA VACCINE  Completed   HPV VACCINES  Aged Out   Hepatitis C Screening   Discontinued   HIV Screening  Discontinued     Last Dental Exam: goes q 72m, last 09/2022, Dr. Madelin Ruth Last Eye Exam: Annually - last 2024, Dr. Lita - [facility] - no issues Last Skin Exam: a few years back with  Dr. Robinson - will schedule follow up  Patient Care Team: Tonita Fallow, MD as PCP - General (Internal Medicine) Marget Lenis, MD as Consulting Physician (Obstetrics and Gynecology) Malcom Brightly, MD as Consulting Physician (Neurology) Robinson Pao, MD as Consulting Physician (Dermatology)  Surgical History:  She has a past surgical history that includes Gynecologic cryosurgery; Mole removal; and Wisdom tooth extraction (1989). Family History:  Herfamily history includes Bipolar disorder in her daughter; Cancer in her maternal grandfather; Colon polyps in her mother; Dementia in her maternal grandmother; Diabetes in an other family member; Heart disease in her maternal grandfather; Hyperlipidemia in her mother; Hypertension in her mother; Hypothyroidism in her father and mother; Irritable bowel syndrome in her mother; Osteopenia in her paternal grandmother; Stroke in her paternal grandfather. Social History:  She reports that she has never smoked. She has never used smokeless tobacco. She reports current alcohol use. She reports that she does not use drugs.  Review of Systems: Review of Systems  Constitutional:  Negative for malaise/fatigue and weight loss.  HENT:  Negative for hearing loss and tinnitus.   Eyes:  Negative for blurred vision and double vision.  Respiratory:  Negative for cough, shortness of breath and wheezing.   Cardiovascular:  Negative for chest pain, palpitations, orthopnea, claudication and leg swelling.  Gastrointestinal:  Negative for abdominal pain, blood in stool, constipation, diarrhea, heartburn, melena, nausea and vomiting.  Genitourinary: Negative.   Musculoskeletal:  Negative for joint pain and myalgias.  Skin:  Negative for rash.   Neurological:  Negative for dizziness, tingling, sensory change, weakness and headaches.  Endo/Heme/Allergies:  Negative for environmental allergies and polydipsia.  Psychiatric/Behavioral:  Positive for depression. Negative for substance abuse and suicidal ideas. The patient has insomnia. The patient is not nervous/anxious.   All other systems reviewed and are negative.   Physical Exam: Estimated body mass index is 35.21 kg/m as calculated from the following:   Height as of this encounter: 5' 5 (1.651 m).   Weight as of this encounter: 211 lb 9.6 oz (96 kg). BP 104/76   Pulse 83   Temp (!) 97.5 F (36.4 C)   Ht 5' 5 (1.651 m)   Wt 211 lb 9.6 oz (96 kg)   SpO2 97%   BMI 35.21 kg/m  General Appearance: Well nourished, in no apparent distress.  Eyes: PERRLA, EOMs, conjunctiva no swelling or erythema Sinuses: No Frontal/maxillary tenderness  ENT/Mouth: Ext aud canals clear, normal light reflex with TMs without erythema, bulging. Good dentition. No erythema, swelling, or exudate on post pharynx. Tonsils not swollen or erythematous. Hearing normal.  Neck: Supple, thyroid  normal. No bruits  Respiratory: Respiratory effort normal, BS equal bilaterally without rales, rhonchi, wheezing or stridor.  Cardio: RRR without murmurs, rubs or gallops. Brisk peripheral pulses without edema.  Chest: symmetric, with normal excursions and percussion.  Breasts: Defer to GYN Abdomen: Soft, nontender, no guarding, rebound, hernias, masses, or organomegaly.  Lymphatics: Non tender without lymphadenopathy.  Genitourinary: Defer to GYN Musculoskeletal: Full ROM all peripheral extremities,5/5 strength, and normal gait.  Skin: Warm, dry without rashes, ecchymosis.  Neuro: Cranial nerves intact, reflexes equal bilaterally. Normal muscle tone, no cerebellar symptoms. Sensation intact.  Psych: Awake and oriented X 3, normal affect, Insight and Judgment appropriate.   EKG: NSR, no ST changes AAA:  < 3 cm     Betzaida Cremeens E Kadeja Granada 10:22 AM [facility]

## 2023-01-19 ENCOUNTER — Encounter: Payer: Self-pay | Admitting: Nurse Practitioner

## 2023-01-19 ENCOUNTER — Ambulatory Visit (INDEPENDENT_AMBULATORY_CARE_PROVIDER_SITE_OTHER): Payer: 59 | Admitting: Nurse Practitioner

## 2023-01-19 VITALS — BP 104/76 | HR 83 | Temp 97.5°F | Ht 65.0 in | Wt 211.6 lb

## 2023-01-19 DIAGNOSIS — Z0001 Encounter for general adult medical examination with abnormal findings: Secondary | ICD-10-CM

## 2023-01-19 DIAGNOSIS — E890 Postprocedural hypothyroidism: Secondary | ICD-10-CM | POA: Diagnosis not present

## 2023-01-19 DIAGNOSIS — E559 Vitamin D deficiency, unspecified: Secondary | ICD-10-CM

## 2023-01-19 DIAGNOSIS — K219 Gastro-esophageal reflux disease without esophagitis: Secondary | ICD-10-CM

## 2023-01-19 DIAGNOSIS — E66812 Obesity, class 2: Secondary | ICD-10-CM

## 2023-01-19 DIAGNOSIS — Z1389 Encounter for screening for other disorder: Secondary | ICD-10-CM

## 2023-01-19 DIAGNOSIS — T466X5A Adverse effect of antihyperlipidemic and antiarteriosclerotic drugs, initial encounter: Secondary | ICD-10-CM

## 2023-01-19 DIAGNOSIS — Z136 Encounter for screening for cardiovascular disorders: Secondary | ICD-10-CM

## 2023-01-19 DIAGNOSIS — Z79899 Other long term (current) drug therapy: Secondary | ICD-10-CM

## 2023-01-19 DIAGNOSIS — Z0000 Encounter for general adult medical examination without abnormal findings: Secondary | ICD-10-CM

## 2023-01-19 DIAGNOSIS — F338 Other recurrent depressive disorders: Secondary | ICD-10-CM

## 2023-01-19 DIAGNOSIS — M791 Myalgia, unspecified site: Secondary | ICD-10-CM

## 2023-01-19 DIAGNOSIS — E782 Mixed hyperlipidemia: Secondary | ICD-10-CM

## 2023-01-19 DIAGNOSIS — I700 Atherosclerosis of aorta: Secondary | ICD-10-CM

## 2023-01-19 DIAGNOSIS — F419 Anxiety disorder, unspecified: Secondary | ICD-10-CM

## 2023-01-19 DIAGNOSIS — Z131 Encounter for screening for diabetes mellitus: Secondary | ICD-10-CM

## 2023-01-19 DIAGNOSIS — G4700 Insomnia, unspecified: Secondary | ICD-10-CM

## 2023-01-19 DIAGNOSIS — Z1211 Encounter for screening for malignant neoplasm of colon: Secondary | ICD-10-CM

## 2023-01-19 DIAGNOSIS — G43009 Migraine without aura, not intractable, without status migrainosus: Secondary | ICD-10-CM

## 2023-01-19 DIAGNOSIS — N182 Chronic kidney disease, stage 2 (mild): Secondary | ICD-10-CM

## 2023-01-19 MED ORDER — ELETRIPTAN HYDROBROMIDE 40 MG PO TABS: ORAL_TABLET | ORAL | 5 refills | Status: AC

## 2023-01-19 NOTE — Patient Instructions (Signed)

## 2023-01-20 LAB — COMPLETE METABOLIC PANEL WITH GFR
AG Ratio: 1.4 (calc) (ref 1.0–2.5)
ALT: 37 U/L — ABNORMAL HIGH (ref 6–29)
AST: 25 U/L (ref 10–35)
Albumin: 4.6 g/dL (ref 3.6–5.1)
Alkaline phosphatase (APISO): 53 U/L (ref 37–153)
BUN: 17 mg/dL (ref 7–25)
CO2: 28 mmol/L (ref 20–32)
Calcium: 10.2 mg/dL (ref 8.6–10.4)
Chloride: 104 mmol/L (ref 98–110)
Creat: 0.78 mg/dL (ref 0.50–1.03)
Globulin: 3.2 g/dL (ref 1.9–3.7)
Glucose, Bld: 84 mg/dL (ref 65–99)
Potassium: 4.3 mmol/L (ref 3.5–5.3)
Sodium: 140 mmol/L (ref 135–146)
Total Bilirubin: 0.4 mg/dL (ref 0.2–1.2)
Total Protein: 7.8 g/dL (ref 6.1–8.1)
eGFR: 91 mL/min/{1.73_m2} (ref >=60)

## 2023-01-20 LAB — MAGNESIUM: Magnesium: 1.9 mg/dL (ref 1.5–2.5)

## 2023-01-20 LAB — CBC WITH DIFFERENTIAL/PLATELET
Absolute Lymphocytes: 2041 {cells}/uL (ref 850–3900)
Absolute Monocytes: 672 {cells}/uL (ref 200–950)
Basophils Absolute: 59 {cells}/uL (ref 0–200)
Basophils Relative: 0.7 %
Eosinophils Absolute: 134 {cells}/uL (ref 15–500)
Eosinophils Relative: 1.6 %
HCT: 45.9 % — ABNORMAL HIGH (ref 35.0–45.0)
Hemoglobin: 15 g/dL (ref 11.7–15.5)
MCH: 29.4 pg (ref 27.0–33.0)
MCHC: 32.7 g/dL (ref 32.0–36.0)
MCV: 89.8 fL (ref 80.0–100.0)
MPV: 12.2 fL (ref 7.5–12.5)
Monocytes Relative: 8 %
Neutro Abs: 5494 {cells}/uL (ref 1500–7800)
Neutrophils Relative %: 65.4 %
Platelets: 321 10*3/uL (ref 140–400)
RBC: 5.11 10*6/uL — ABNORMAL HIGH (ref 3.80–5.10)
RDW: 12.8 % (ref 11.0–15.0)
Total Lymphocyte: 24.3 %
WBC: 8.4 10*3/uL (ref 3.8–10.8)

## 2023-01-20 LAB — MICROALBUMIN / CREATININE URINE RATIO
Creatinine, Urine: 12 mg/dL — ABNORMAL LOW (ref 20–275)
Microalb, Ur: <0.2 mg/dL

## 2023-01-20 LAB — URINALYSIS, ROUTINE W REFLEX MICROSCOPIC
Bilirubin Urine: NEGATIVE
Glucose, UA: NEGATIVE
Hgb urine dipstick: NEGATIVE
Ketones, ur: NEGATIVE
Leukocytes,Ua: NEGATIVE
Nitrite: NEGATIVE
Protein, ur: NEGATIVE
Specific Gravity, Urine: 1.003 (ref 1.001–1.035)
pH: 8 (ref 5.0–8.0)

## 2023-01-20 LAB — LIPID PANEL
Cholesterol: 245 mg/dL — ABNORMAL HIGH (ref <200)
HDL: 67 mg/dL (ref >=50)
LDL Cholesterol (Calc): 136 mg/dL — ABNORMAL HIGH
Non-HDL Cholesterol (Calc): 178 mg/dL — ABNORMAL HIGH (ref <130)
Total CHOL/HDL Ratio: 3.7 (calc) (ref <5.0)
Triglycerides: 276 mg/dL — ABNORMAL HIGH (ref <150)

## 2023-01-20 LAB — VITAMIN D 25 HYDROXY (VIT D DEFICIENCY, FRACTURES): Vit D, 25-Hydroxy: 64 ng/mL (ref 30–100)

## 2023-01-20 LAB — HEMOGLOBIN A1C W/OUT EAG: Hgb A1c MFr Bld: 5.4 %{Hb} (ref <5.7)

## 2023-01-20 LAB — TSH: TSH: 1.34 m[IU]/L

## 2023-02-13 ENCOUNTER — Encounter: Payer: Self-pay | Admitting: Internal Medicine

## 2023-02-16 LAB — COLOGUARD: COLOGUARD: NEGATIVE

## 2023-02-20 ENCOUNTER — Encounter: Payer: Self-pay | Admitting: Nurse Practitioner

## 2023-04-18 ENCOUNTER — Other Ambulatory Visit (HOSPITAL_BASED_OUTPATIENT_CLINIC_OR_DEPARTMENT_OTHER): Payer: Self-pay

## 2023-04-18 MED ORDER — ZEPBOUND 5 MG/0.5ML ~~LOC~~ SOAJ
5.0000 mg | SUBCUTANEOUS | 0 refills | Status: DC
Start: 2023-04-18 — End: 2023-05-16
  Filled 2023-04-18: qty 2, 28d supply, fill #0

## 2023-04-20 ENCOUNTER — Other Ambulatory Visit (HOSPITAL_BASED_OUTPATIENT_CLINIC_OR_DEPARTMENT_OTHER): Payer: Self-pay

## 2023-05-16 ENCOUNTER — Other Ambulatory Visit (HOSPITAL_BASED_OUTPATIENT_CLINIC_OR_DEPARTMENT_OTHER): Payer: Self-pay

## 2023-05-16 MED ORDER — ZEPBOUND 5 MG/0.5ML ~~LOC~~ SOAJ
5.0000 mg | SUBCUTANEOUS | 0 refills | Status: AC
Start: 2023-05-16 — End: ?
  Filled 2023-05-16: qty 2, 28d supply, fill #0

## 2023-06-20 ENCOUNTER — Other Ambulatory Visit (HOSPITAL_BASED_OUTPATIENT_CLINIC_OR_DEPARTMENT_OTHER): Payer: Self-pay

## 2023-06-20 MED ORDER — ZEPBOUND 7.5 MG/0.5ML ~~LOC~~ SOAJ
7.5000 mg | SUBCUTANEOUS | 0 refills | Status: DC
  Filled 2023-06-20: qty 2, 28d supply, fill #0

## 2023-07-18 ENCOUNTER — Other Ambulatory Visit (HOSPITAL_BASED_OUTPATIENT_CLINIC_OR_DEPARTMENT_OTHER): Payer: Self-pay

## 2023-07-18 MED ORDER — ZEPBOUND 7.5 MG/0.5ML ~~LOC~~ SOAJ
7.5000 mg | SUBCUTANEOUS | 0 refills | Status: DC
  Filled 2023-07-18: qty 2, 28d supply, fill #0

## 2023-07-23 ENCOUNTER — Ambulatory Visit: Payer: 59 | Admitting: Nurse Practitioner

## 2023-08-15 ENCOUNTER — Other Ambulatory Visit (HOSPITAL_BASED_OUTPATIENT_CLINIC_OR_DEPARTMENT_OTHER): Payer: Self-pay

## 2023-08-15 MED ORDER — WEGOVY 1.7 MG/0.75ML ~~LOC~~ SOAJ
1.7000 mg | SUBCUTANEOUS | 0 refills | Status: AC
  Filled 2023-08-15: qty 3, 28d supply, fill #0

## 2023-09-12 ENCOUNTER — Other Ambulatory Visit: Payer: Self-pay

## 2023-09-12 ENCOUNTER — Other Ambulatory Visit (HOSPITAL_BASED_OUTPATIENT_CLINIC_OR_DEPARTMENT_OTHER): Payer: Self-pay

## 2023-09-12 MED ORDER — WEGOVY 2.4 MG/0.75ML ~~LOC~~ SOAJ
2.4000 mg | SUBCUTANEOUS | 0 refills | Status: AC
  Filled 2023-09-12: qty 3, 28d supply, fill #0

## 2023-10-10 ENCOUNTER — Other Ambulatory Visit (HOSPITAL_BASED_OUTPATIENT_CLINIC_OR_DEPARTMENT_OTHER): Payer: Self-pay

## 2023-10-10 MED ORDER — ZEPBOUND 7.5 MG/0.5ML ~~LOC~~ SOAJ
7.5000 mg | SUBCUTANEOUS | 1 refills | Status: AC
  Filled 2023-10-10 – 2023-10-19 (×2): qty 2, 28d supply, fill #0

## 2023-10-11 ENCOUNTER — Other Ambulatory Visit (HOSPITAL_BASED_OUTPATIENT_CLINIC_OR_DEPARTMENT_OTHER): Payer: Self-pay

## 2023-10-14 ENCOUNTER — Other Ambulatory Visit: Payer: Self-pay | Admitting: Nurse Practitioner

## 2023-10-14 DIAGNOSIS — G4700 Insomnia, unspecified: Secondary | ICD-10-CM

## 2023-10-19 ENCOUNTER — Other Ambulatory Visit (HOSPITAL_BASED_OUTPATIENT_CLINIC_OR_DEPARTMENT_OTHER): Payer: Self-pay

## 2023-10-29 ENCOUNTER — Other Ambulatory Visit (HOSPITAL_BASED_OUTPATIENT_CLINIC_OR_DEPARTMENT_OTHER): Payer: Self-pay

## 2023-10-29 MED ORDER — ONDANSETRON HCL 4 MG PO TABS
4.0000 mg | ORAL_TABLET | Freq: Three times a day (TID) | ORAL | 1 refills | Status: AC | PRN
  Filled 2023-10-29: qty 30, 10d supply, fill #0

## 2023-10-29 MED ORDER — MOUNJARO 5 MG/0.5ML ~~LOC~~ SOAJ
5.0000 mg | SUBCUTANEOUS | 1 refills | Status: DC
  Filled 2023-10-29: qty 2, 28d supply, fill #0
  Filled 2023-11-22: qty 2, 28d supply, fill #1

## 2023-12-04 ENCOUNTER — Other Ambulatory Visit (HOSPITAL_BASED_OUTPATIENT_CLINIC_OR_DEPARTMENT_OTHER): Payer: Self-pay

## 2023-12-27 ENCOUNTER — Other Ambulatory Visit (HOSPITAL_BASED_OUTPATIENT_CLINIC_OR_DEPARTMENT_OTHER): Payer: Self-pay

## 2023-12-27 MED ORDER — MOUNJARO 5 MG/0.5ML ~~LOC~~ SOAJ
5.0000 mg | SUBCUTANEOUS | 1 refills | Status: AC
  Filled 2023-12-27: qty 2, 28d supply, fill #0
  Filled 2024-01-24: qty 2, 28d supply, fill #1

## 2024-01-22 ENCOUNTER — Encounter: Payer: 59 | Admitting: Nurse Practitioner

## 2024-01-24 ENCOUNTER — Other Ambulatory Visit (HOSPITAL_BASED_OUTPATIENT_CLINIC_OR_DEPARTMENT_OTHER): Payer: Self-pay
